# Patient Record
Sex: Male | Born: 1945 | Race: White | Hispanic: No | Smoking: Never smoker
Health system: Southern US, Community
[De-identification: ages and names within clinical notes are randomized; demographics above are authoritative.]

## PROBLEM LIST (undated history)

## (undated) DIAGNOSIS — M75101 Unspecified rotator cuff tear or rupture of right shoulder, not specified as traumatic: Secondary | ICD-10-CM

## (undated) DIAGNOSIS — M1990 Unspecified osteoarthritis, unspecified site: Secondary | ICD-10-CM

## (undated) DIAGNOSIS — J3089 Other allergic rhinitis: Secondary | ICD-10-CM

## (undated) DIAGNOSIS — R0600 Dyspnea, unspecified: Secondary | ICD-10-CM

## (undated) DIAGNOSIS — E785 Hyperlipidemia, unspecified: Secondary | ICD-10-CM

## (undated) DIAGNOSIS — N200 Calculus of kidney: Secondary | ICD-10-CM

## (undated) DIAGNOSIS — N400 Enlarged prostate without lower urinary tract symptoms: Secondary | ICD-10-CM

## (undated) DIAGNOSIS — N390 Urinary tract infection, site not specified: Secondary | ICD-10-CM

## (undated) DIAGNOSIS — L719 Rosacea, unspecified: Secondary | ICD-10-CM

## (undated) DIAGNOSIS — K219 Gastro-esophageal reflux disease without esophagitis: Secondary | ICD-10-CM

## (undated) DIAGNOSIS — A419 Sepsis, unspecified organism: Secondary | ICD-10-CM

## (undated) HISTORY — DX: Hyperlipidemia, unspecified: E78.5

## (undated) HISTORY — DX: Gastro-esophageal reflux disease without esophagitis: K21.9

## (undated) HISTORY — PX: RECONSTRUCTION OF EYELID: SHX6576

## (undated) HISTORY — PX: EXTRACORPOREAL SHOCK WAVE LITHOTRIPSY: SHX1557

## (undated) HISTORY — DX: Unspecified osteoarthritis, unspecified site: M19.90

## (undated) HISTORY — PX: URETERAL STENT PLACEMENT: SHX822

---

## 2004-10-10 ENCOUNTER — Ambulatory Visit: Payer: Self-pay

## 2004-10-24 ENCOUNTER — Ambulatory Visit: Payer: Self-pay | Admitting: Urology

## 2005-02-22 ENCOUNTER — Ambulatory Visit: Payer: Self-pay | Admitting: Urology

## 2010-04-17 ENCOUNTER — Emergency Department: Payer: Self-pay | Admitting: Emergency Medicine

## 2013-03-09 ENCOUNTER — Ambulatory Visit: Payer: Self-pay | Admitting: Internal Medicine

## 2013-09-08 ENCOUNTER — Ambulatory Visit: Payer: Self-pay | Admitting: Internal Medicine

## 2015-03-12 ENCOUNTER — Emergency Department
Admit: 2015-03-12 | Disposition: A | Discharge status: Home / Self Care | Payer: Self-pay | Admitting: Emergency Medicine

## 2015-03-12 LAB — CBC WITH DIFFERENTIAL/PLATELET
Basophil #: 0.2 10*3/uL — ABNORMAL HIGH (ref 0.0–0.1)
Basophil %: 3.4 %
Eosinophil #: 0.3 10*3/uL (ref 0.0–0.7)
Eosinophil %: 4.5 %
HCT: 49.6 % (ref 40.0–52.0)
HGB: 16.4 g/dL (ref 13.0–18.0)
LYMPHS PCT: 24.8 %
Lymphocyte #: 1.7 10*3/uL (ref 1.0–3.6)
MCH: 30.6 pg (ref 26.0–34.0)
MCHC: 33 g/dL (ref 32.0–36.0)
MCV: 93 fL (ref 80–100)
MONOS PCT: 6.7 %
Monocyte #: 0.5 x10 3/mm (ref 0.2–1.0)
NEUTROS PCT: 60.6 %
Neutrophil #: 4.2 10*3/uL (ref 1.4–6.5)
PLATELETS: 170 10*3/uL (ref 150–440)
RBC: 5.36 10*6/uL (ref 4.40–5.90)
RDW: 14 % (ref 11.5–14.5)
WBC: 7 10*3/uL (ref 3.8–10.6)

## 2015-03-12 LAB — COMPREHENSIVE METABOLIC PANEL
ALT: 54 U/L
ANION GAP: 11 (ref 7–16)
AST: 51 U/L — AB
Albumin: 5.1 g/dL — ABNORMAL HIGH
Alkaline Phosphatase: 78 U/L
BUN: 18 mg/dL
Bilirubin,Total: 3.5 mg/dL — ABNORMAL HIGH
CO2: 27 mmol/L
Calcium, Total: 9.9 mg/dL
Chloride: 102 mmol/L
Creatinine: 1.05 mg/dL
EGFR (African American): >60
EGFR (Non-African Amer.): >60
GLUCOSE: 114 mg/dL — AB
Potassium: 4.1 mmol/L
SODIUM: 140 mmol/L
Total Protein: 8.5 g/dL — ABNORMAL HIGH

## 2015-03-12 LAB — URINALYSIS, COMPLETE
BACTERIA: NONE SEEN
GLUCOSE, UR: NEGATIVE mg/dL (ref 0–75)
KETONE: NEGATIVE
Leukocyte Esterase: NEGATIVE
Nitrite: NEGATIVE
PROTEIN: NEGATIVE
Ph: 5 (ref 4.5–8.0)
SPECIFIC GRAVITY: 1.018 (ref 1.003–1.030)
Squamous Epithelial: NONE SEEN

## 2015-03-12 LAB — LIPASE, BLOOD: Lipase: 49 U/L

## 2017-12-04 ENCOUNTER — Other Ambulatory Visit: Payer: Self-pay | Admitting: Surgery

## 2017-12-04 DIAGNOSIS — M75101 Unspecified rotator cuff tear or rupture of right shoulder, not specified as traumatic: Secondary | ICD-10-CM

## 2017-12-10 ENCOUNTER — Ambulatory Visit
Admission: RE | Admit: 2017-12-10 | Discharge: 2017-12-10 | Disposition: A | Discharge status: Home / Self Care | Payer: Medicare Other | Source: Ambulatory Visit | Attending: Surgery | Admitting: Surgery

## 2017-12-10 DIAGNOSIS — M75101 Unspecified rotator cuff tear or rupture of right shoulder, not specified as traumatic: Secondary | ICD-10-CM

## 2017-12-10 DIAGNOSIS — M12811 Other specific arthropathies, not elsewhere classified, right shoulder: Secondary | ICD-10-CM | POA: Diagnosis not present

## 2018-12-07 DIAGNOSIS — N200 Calculus of kidney: Secondary | ICD-10-CM

## 2018-12-07 DIAGNOSIS — N201 Calculus of ureter: Secondary | ICD-10-CM | POA: Diagnosis not present

## 2018-12-15 ENCOUNTER — Encounter: Payer: Self-pay | Admitting: Urology

## 2018-12-15 ENCOUNTER — Ambulatory Visit (INDEPENDENT_AMBULATORY_CARE_PROVIDER_SITE_OTHER): Payer: Medicare Other | Admitting: Urology

## 2018-12-15 VITALS — BP 135/84 | HR 87 | Ht 67.0 in | Wt 224.2 lb

## 2018-12-15 DIAGNOSIS — R972 Elevated prostate specific antigen [PSA]: Secondary | ICD-10-CM

## 2018-12-15 LAB — URINALYSIS, COMPLETE
Bilirubin, UA: NEGATIVE
GLUCOSE, UA: NEGATIVE
Ketones, UA: NEGATIVE
Nitrite, UA: NEGATIVE
Protein, UA: NEGATIVE
Specific Gravity, UA: 1.02 (ref 1.005–1.030)
Urobilinogen, Ur: 0.2 mg/dL (ref 0.2–1.0)
pH, UA: 6.5 (ref 5.0–7.5)

## 2018-12-15 LAB — MICROSCOPIC EXAMINATION: Epithelial Cells (non renal): NONE SEEN /hpf (ref 0–10)

## 2018-12-15 LAB — BLADDER SCAN AMB NON-IMAGING: SCAN RESULT: 0

## 2018-12-15 NOTE — Addendum Note (Signed)
Addended by: Frankey Shown on: 12/15/2018 01:14 PM   Modules accepted: Orders

## 2018-12-15 NOTE — Progress Notes (Signed)
12/15/2018 11:35 AM   Ryan Benjamin 1946-04-18 [id number]  Referring provider: Marguarite Arbour, MD [address] [place], [place] [address]  Chief Complaint  Patient presents with  . Elevated PSA    HPI: 72 year old male seen at request of Dr. Judithann Sheen for evaluation of an elevated PSA.  A PSA drawn at [facility] on 11/11/2018 was elevated at 24.76.  Baseline PSA since 2015 has ranged in the low 3 to upper 4 range.  He has moderate lower urinary tract symptoms consisting of frequency, intermittency, urgency, weak stream and nocturia x4.  IPSS completed today was 15/2.  He states I have previously seen him twice once in late 1990s for a stone and began approximately 10 years ago for a mild PSA elevation which returned to normal.  Denies dysuria, gross hematuria or flank/abdominal/pelvic/scrotal pain.  Urinalysis at the time of his PSA was drawn showed 10-50 WBC and a urine culture was positive for Enterococcus faecalis.  He was treated with a course of Cipro although he was asymptomatic.   PMH: Past Medical History:  Diagnosis Date  . Arthritis   . GERD (gastroesophageal reflux disease)   . Hyperlipemia     Surgical History: Past Surgical History:  Procedure Laterality Date  . RECONSTRUCTION OF EYELID      Home Medications:  Allergies as of 12/15/2018   No Known Allergies     Medication List       Accurate as of December 15, 2018 11:35 AM. Always use your most recent med list.        aspirin EC 81 MG tablet Take 81 mg by mouth daily.   atorvastatin 20 MG tablet Commonly known as:  LIPITOR Take by mouth.   esomeprazole 20 MG capsule Commonly known as:  NEXIUM Take by mouth.   tamsulosin 0.4 MG Caps capsule Commonly known as:  FLOMAX Take by mouth.       Allergies: No Known Allergies  Family History: Family History  Problem Relation Age of Onset  . Prostate cancer Neg Hx   . Bladder Cancer Neg Hx   . Kidney cancer Neg Hx     Social  History:  reports that he has never smoked. He has never used smokeless tobacco. He reports that he does not drink alcohol. No history on file for drug.  ROS: UROLOGY Frequent Urination?: No Hard to postpone urination?: No Burning/pain with urination?: No Get up at night to urinate?: No Leakage of urine?: No Urine stream starts and stops?: No Trouble starting stream?: No Do you have to strain to urinate?: No Blood in urine?: No Urinary tract infection?: No Sexually transmitted disease?: No Injury to kidneys or bladder?: No Painful intercourse?: No Weak stream?: No Erection problems?: No Penile pain?: No  Gastrointestinal Nausea?: No Vomiting?: No Indigestion/heartburn?: No Diarrhea?: No Constipation?: No  Constitutional Fever: No Night sweats?: No Weight loss?: No Fatigue?: No  Skin Skin rash/lesions?: No Itching?: No  Eyes Blurred vision?: No Double vision?: No  Ears/Nose/Throat Sore throat?: No Sinus problems?: No  Hematologic/Lymphatic Swollen glands?: No Easy bruising?: No  Cardiovascular Leg swelling?: No Chest pain?: No  Respiratory Cough?: No Shortness of breath?: No  Endocrine Excessive thirst?: No  Musculoskeletal Back pain?: No Joint pain?: Yes  Neurological Headaches?: No Dizziness?: No  Psychologic Depression?: No Anxiety?: No  Physical Exam: BP 135/84 (BP Location: Left Arm, Patient Position: Sitting, Cuff Size: Large)   Pulse 87   Ht 5\' 7"  (1.702 m)   Wt  224 lb 3.2 oz (101.7 kg)   BMI 35.11 kg/m   Constitutional:  Alert and oriented, No acute distress. HEENT: [place] AT, moist mucus membranes.  Trachea midline, no masses. Cardiovascular: No clubbing, cyanosis, or edema. Respiratory: Normal respiratory effort, no increased work of breathing. GI: Abdomen is soft, nontender, nondistended, no abdominal masses GU: No CVA tenderness.  Prostate 60+ grams, smooth without nodules. Lymph: No cervical or inguinal  lymphadenopathy. Skin: No rashes, bruises or suspicious lesions. Neurologic: Grossly intact, no focal deficits, moving all 4 extremities. Psychiatric: Normal mood and affect.   Assessment & Plan:   73 year old male with a PSA elevation significantly above baseline.  DRE is benign.  Urinalysis and culture at the time his PSA was drawn were abnormal.  He has been treated with Cipro and tamsulosin.  Will repeat his PSA in approximately 1 month.  Repeat urinalysis was ordered.   Riki Altes, MD  [facility] [address] [place], [place] [address] [phone]

## 2018-12-16 ENCOUNTER — Telehealth: Payer: Self-pay | Admitting: Urology

## 2018-12-16 NOTE — Telephone Encounter (Signed)
Do not see in your note where you where planing on sending a medication, please advise?

## 2018-12-16 NOTE — Telephone Encounter (Signed)
Pt states that he thought Dr Lonna Cobb was going to call in some meds after yesterday's visit. He uses [facility] on [address], [place]

## 2018-12-17 NOTE — Telephone Encounter (Signed)
I was going to start Ryan Benjamin on tamsulosin for 30 days however it looks like Dr. Judithann Sheen had already started Ryan Benjamin on tamsulosin.  I had sent a staff message to verify the patient was taking tamsulosin.  If not can send in an Rx.

## 2018-12-18 LAB — CULTURE, URINE COMPREHENSIVE

## 2018-12-21 ENCOUNTER — Other Ambulatory Visit: Payer: Self-pay | Admitting: Urology

## 2018-12-21 MED ORDER — DOXYCYCLINE HYCLATE 100 MG PO CAPS: 100.0000 mg | ORAL_CAPSULE | Freq: Two times a day (BID) | ORAL | 0 refills | Status: AC

## 2018-12-21 NOTE — Progress Notes (Signed)
Urine culture still has bacteria present which may be a cause of his elevated PSA.  Antibiotic Rx was sent to his pharmacy.  Please make sure he is scheduled for a repeat PSA 1 month.

## 2018-12-22 NOTE — Telephone Encounter (Signed)
Pt returned call and I [facility]

## 2018-12-22 NOTE — Telephone Encounter (Signed)
-----   Message from Riki Altes, MD sent at 12/21/2018  9:58 AM EST ----- Urine culture still has bacteria present which may be a cause of his elevated PSA.  Antibiotic Rx was sent to his pharmacy.  Please make sure he is scheduled for a repeat PSA 1 month.

## 2018-12-22 NOTE — Telephone Encounter (Signed)
Left message for patient to return call.

## 2018-12-29 ENCOUNTER — Other Ambulatory Visit: Payer: Self-pay

## 2018-12-29 DIAGNOSIS — R972 Elevated prostate specific antigen [PSA]: Secondary | ICD-10-CM

## 2018-12-29 DIAGNOSIS — N410 Acute prostatitis: Secondary | ICD-10-CM

## 2018-12-29 NOTE — Telephone Encounter (Signed)
Called pt, no answer. LM for pt to call back.  

## 2018-12-29 NOTE — Telephone Encounter (Signed)
Pt returned your call Marvin. Please call back.

## 2018-12-29 NOTE — Telephone Encounter (Signed)
-----   Message from Riki Altes, MD sent at 12/15/2018 12:27 PM EST ----- I was going to send Rx Flomax to pharmacy for this patient however it looks like Dr. Judithann Sheen started him last month.  Please make sure he has medication and is still taking.  If so we will just repeat PSA 1 month.

## 2018-12-30 NOTE — Telephone Encounter (Signed)
Called pt, line picked up and then hung up.

## 2019-01-02 MED ORDER — TAMSULOSIN HCL 0.4 MG PO CAPS: 0.4000 mg | ORAL_CAPSULE | Freq: Every day | ORAL | 3 refills | Status: DC

## 2019-01-02 NOTE — Telephone Encounter (Signed)
Called pt, line picked up and then phone was hung up. 3rd attempt.   Called pt's mobile number, pt answers. He states that Dr.Sparks only gave him a short term supply of flomax. Advised pt that I would send in a long term RX. Pt also requests that his urine be checked at his upcoming lab appt will place orders.

## 2019-01-08 ENCOUNTER — Other Ambulatory Visit: Payer: Medicare Other

## 2019-01-08 DIAGNOSIS — N410 Acute prostatitis: Secondary | ICD-10-CM

## 2019-01-08 DIAGNOSIS — R972 Elevated prostate specific antigen [PSA]: Secondary | ICD-10-CM

## 2019-01-08 LAB — MICROSCOPIC EXAMINATION: Epithelial Cells (non renal): NONE SEEN /hpf (ref 0–10)

## 2019-01-08 LAB — URINALYSIS, COMPLETE
Bilirubin, UA: NEGATIVE
Glucose, UA: NEGATIVE
Leukocytes, UA: NEGATIVE
NITRITE UA: NEGATIVE
PH UA: 5.5 (ref 5.0–7.5)
Specific Gravity, UA: >1.03 — ABNORMAL HIGH (ref 1.005–1.030)
Urobilinogen, Ur: 0.2 mg/dL (ref 0.2–1.0)

## 2019-01-09 LAB — PSA: PROSTATE SPECIFIC AG, SERUM: 6.7 ng/mL — AB (ref 0.0–4.0)

## 2019-01-12 ENCOUNTER — Telehealth: Payer: Self-pay | Admitting: Family Medicine

## 2019-01-12 LAB — CULTURE, URINE COMPREHENSIVE

## 2019-01-12 NOTE — Telephone Encounter (Signed)
-----   Message from Riki Altes, MD sent at 01/12/2019  2:15 PM EST ----- PSA much better at 6.7 but still elevated above baseline.  Urine culture grew a low level of E. coli.  Would treat with additional antibiotics if he is having symptoms.  If not having symptoms recommend a follow-up in 6 weeks with Carollee Herter for a urinalysis and repeat PSA if UA clear.

## 2019-01-12 NOTE — Telephone Encounter (Signed)
Unable to reach patient the phone was not working properly.

## 2019-01-14 NOTE — Telephone Encounter (Signed)
LMOM for patient to return call.

## 2019-01-15 NOTE — Telephone Encounter (Signed)
Tried to call patient again and the phone picked up and hung up.

## 2019-01-20 NOTE — Telephone Encounter (Signed)
Called pt, no answer. No voicemail is set up will mail letter. See documention.

## 2019-02-20 ENCOUNTER — Other Ambulatory Visit: Payer: Self-pay | Admitting: Surgery

## 2019-02-20 DIAGNOSIS — M7582 Other shoulder lesions, left shoulder: Secondary | ICD-10-CM

## 2019-02-24 ENCOUNTER — Other Ambulatory Visit: Payer: Medicare Other

## 2019-02-26 ENCOUNTER — Ambulatory Visit: Payer: Medicare Other | Admitting: Urology

## 2019-03-23 ENCOUNTER — Other Ambulatory Visit: Payer: Self-pay

## 2019-03-23 DIAGNOSIS — R972 Elevated prostate specific antigen [PSA]: Secondary | ICD-10-CM

## 2019-03-24 ENCOUNTER — Other Ambulatory Visit: Payer: Self-pay

## 2019-03-24 ENCOUNTER — Other Ambulatory Visit: Payer: Medicare Other

## 2019-03-24 ENCOUNTER — Ambulatory Visit: Payer: Medicare Other

## 2019-03-24 DIAGNOSIS — R972 Elevated prostate specific antigen [PSA]: Secondary | ICD-10-CM

## 2019-03-25 LAB — PSA: Prostate Specific Ag, Serum: 5.8 ng/mL — ABNORMAL HIGH (ref 0.0–4.0)

## 2019-03-27 ENCOUNTER — Ambulatory Visit: Payer: Medicare Other | Admitting: Urology

## 2019-04-07 ENCOUNTER — Other Ambulatory Visit: Payer: Self-pay

## 2019-04-07 ENCOUNTER — Other Ambulatory Visit: Payer: Medicare Other

## 2019-04-07 ENCOUNTER — Ambulatory Visit
Admission: RE | Admit: 2019-04-07 | Discharge: 2019-04-07 | Disposition: A | Discharge status: Home / Self Care | Payer: Medicare Other | Source: Ambulatory Visit | Attending: Surgery | Admitting: Surgery

## 2019-04-07 DIAGNOSIS — M7582 Other shoulder lesions, left shoulder: Secondary | ICD-10-CM | POA: Insufficient documentation

## 2019-04-07 DIAGNOSIS — R972 Elevated prostate specific antigen [PSA]: Secondary | ICD-10-CM

## 2019-04-07 LAB — URINALYSIS, COMPLETE
Bilirubin, UA: NEGATIVE
Glucose, UA: NEGATIVE
Nitrite, UA: NEGATIVE
Specific Gravity, UA: 1.025 (ref 1.005–1.030)
Urobilinogen, Ur: 0.2 mg/dL (ref 0.2–1.0)
pH, UA: 6 (ref 5.0–7.5)

## 2019-04-07 LAB — MICROSCOPIC EXAMINATION: WBC, UA: >30 /hpf — AB (ref 0–5)

## 2019-04-07 NOTE — Progress Notes (Unsigned)
Per Dr. Lonna Cobb patient was notified that urine today will be sent for culture and we will not send PSA . A renal u/s has been ordered and we will call with results. Will wait to treat on cx patient is not having symptoms

## 2019-04-09 LAB — CULTURE, URINE COMPREHENSIVE

## 2019-04-10 ENCOUNTER — Other Ambulatory Visit: Payer: Self-pay | Admitting: Urology

## 2019-04-10 ENCOUNTER — Encounter: Payer: Self-pay | Admitting: Urology

## 2019-04-10 MED ORDER — AMOXICILLIN 875 MG PO TABS: 875.0000 mg | ORAL_TABLET | Freq: Two times a day (BID) | ORAL | 0 refills | Status: AC

## 2019-04-13 ENCOUNTER — Telehealth: Payer: Self-pay

## 2019-04-13 NOTE — Telephone Encounter (Signed)
Called pt no answer. LM informing pt of information below.  

## 2019-04-13 NOTE — Telephone Encounter (Signed)
-----   Message from Riki Altes, MD sent at 04/09/2019 11:59 PM EDT ----- Urine culture was positive.  Antibiotic Rx was sent to pharmacy.  Notified via MyChart

## 2019-04-14 ENCOUNTER — Ambulatory Visit: Payer: Medicare Other | Admitting: Urology

## 2019-04-16 ENCOUNTER — Other Ambulatory Visit: Payer: Self-pay

## 2019-04-16 ENCOUNTER — Telehealth (INDEPENDENT_AMBULATORY_CARE_PROVIDER_SITE_OTHER): Payer: Medicare Other | Admitting: Urology

## 2019-04-16 DIAGNOSIS — N200 Calculus of kidney: Secondary | ICD-10-CM | POA: Insufficient documentation

## 2019-04-16 DIAGNOSIS — R8271 Bacteriuria: Secondary | ICD-10-CM

## 2019-04-16 DIAGNOSIS — R972 Elevated prostate specific antigen [PSA]: Secondary | ICD-10-CM | POA: Insufficient documentation

## 2019-04-16 DIAGNOSIS — N400 Enlarged prostate without lower urinary tract symptoms: Secondary | ICD-10-CM | POA: Insufficient documentation

## 2019-04-16 DIAGNOSIS — N401 Enlarged prostate with lower urinary tract symptoms: Secondary | ICD-10-CM

## 2019-04-16 NOTE — Progress Notes (Signed)
Virtual Visit via Telephone Note  I connected with Ryan Benjamin on 04/16/19 at 11:30 AM EDT by telephone and verified that I am speaking with the correct person using two identifiers.  Location: Patient: Home Provider: Home   I discussed the limitations, risks, security and privacy concerns of performing an evaluation and management service by telephone and the availability of in person appointments. I also discussed with the patient that there may be a patient responsible charge related to this service. The patient expressed understanding and agreed to proceed.   History of Present Illness: 73 year old male presents for follow-up visit via telephone secondary to COVID-19 pandemic.  He was initially referred in January 2020 for a PSA of 24.76.  He did have moderate lower urinary tract symptoms.  He had significant pyuria at the time his PSA was drawn and had enterococcus bacteriuria.  Since January he has had 3+ urine cultures 2 for enterococcus and 1 for E. coli and has been asymptomatic.  He had no significant PVR by bladder scan.  He does have a history of stone disease and a CT in 2016 did show bilateral nephrolithiasis.  Denies flank or abdominal pain.  He is on tamsulosin and denies bothersome lower urinary tract symptoms.  PSA trend as follows: 08/2014  3.16 09/2015 3.87 09/2016 3.17 09/2017 4.94 12/2017  3.84 03/2018  3.12 09/2018 4.93 11/2018 24.76 01/2019  6.7 03/2019  5.8  He does have a ED and has been on sildenafil prescribed by Dr. Judithann Sheen.  He states he recently saw Dr. Judithann Sheen and had a testosterone level checked because he did not feel the sildenafil was as effective.   Observations/Objective:   Assessment and Plan: 1.  Chronic asymptomatic bacteriuria -Prior bladder scan for PVR was 0 mL.  Although his bacteria are not typically urease producers he has a history of nephrolithiasis and will order a renal ultrasound/KUB.  2.  Elevated PSA -PSA is mildly elevated  and may be secondary to inflammation.  We discussed possibility of prostate cancer and management options of continued surveillance, prostate biopsy or prostate MRI.  He would like an MRI if covered by insurance.  3.  Erectile dysfunction -He was informed that hypogonadism typically does not make a big difference in erections and has most of an impact with libido and energy level.  If his testosterone level was low and he was interested in replacement he would need a prostate biopsy.  Follow Up Instructions: As above   I discussed the assessment and treatment plan with the patient. The patient was provided an opportunity to ask questions and all were answered. The patient agreed with the plan and demonstrated an understanding of the instructions.   The patient was advised to call back or seek an in-person evaluation if the symptoms worsen or if the condition fails to improve as anticipated.  I provided 15 minutes of non-face-to-face time during this encounter.   Riki Altes, MD

## 2019-04-22 ENCOUNTER — Other Ambulatory Visit: Payer: Self-pay

## 2019-04-30 ENCOUNTER — Ambulatory Visit
Admission: RE | Admit: 2019-04-30 | Discharge: 2019-04-30 | Disposition: A | Discharge status: Home / Self Care | Payer: Medicare Other | Source: Ambulatory Visit | Attending: Urology | Admitting: Urology

## 2019-04-30 ENCOUNTER — Other Ambulatory Visit: Payer: Self-pay

## 2019-04-30 DIAGNOSIS — N200 Calculus of kidney: Secondary | ICD-10-CM

## 2019-05-14 ENCOUNTER — Other Ambulatory Visit: Payer: Self-pay

## 2019-05-14 ENCOUNTER — Ambulatory Visit
Admission: RE | Admit: 2019-05-14 | Discharge: 2019-05-14 | Disposition: A | Discharge status: Home / Self Care | Payer: Medicare Other | Source: Ambulatory Visit | Attending: Urology | Admitting: Urology

## 2019-05-14 DIAGNOSIS — R972 Elevated prostate specific antigen [PSA]: Secondary | ICD-10-CM | POA: Insufficient documentation

## 2019-05-14 MED ORDER — GADOBUTROL 1 MMOL/ML IV SOLN
10.0000 mL | Freq: Once | INTRAVENOUS | Status: AC | PRN
  Administered 2019-05-14: 10 mL via INTRAVENOUS

## 2019-05-15 ENCOUNTER — Telehealth: Payer: Self-pay | Admitting: Urology

## 2019-05-15 NOTE — Telephone Encounter (Signed)
KUB and renal ultrasound show a nonobstructing stone in the left kidney which can be monitored.  It is unlikely this would be a cause of bacteria in his urine.  Prostate MRI showed no area suspicious for prostate cancer.  Prostate enlargement was noted.  Recommend a six-month follow-up for DRE, KUB and PSA.

## 2019-05-15 NOTE — Telephone Encounter (Signed)
LMOM for patient to return call.

## 2019-05-15 NOTE — Telephone Encounter (Signed)
Patient notified

## 2019-05-20 NOTE — Telephone Encounter (Signed)
Patient was notified that the report does not state a size of stone just states small probable stone. Patient verbalized understanding

## 2019-05-20 NOTE — Telephone Encounter (Signed)
Pt called and would like a call back concerning his kidney stone, he was asking how big it is, etc..Please advise.

## 2019-06-24 ENCOUNTER — Encounter
Admission: RE | Admit: 2019-06-24 | Discharge: 2019-06-24 | Disposition: A | Discharge status: Home / Self Care | Payer: Medicare Other | Source: Ambulatory Visit | Attending: Surgery | Admitting: Surgery

## 2019-06-24 ENCOUNTER — Other Ambulatory Visit: Payer: Self-pay

## 2019-06-24 DIAGNOSIS — R001 Bradycardia, unspecified: Secondary | ICD-10-CM | POA: Diagnosis not present

## 2019-06-24 DIAGNOSIS — I10 Essential (primary) hypertension: Secondary | ICD-10-CM | POA: Insufficient documentation

## 2019-06-24 DIAGNOSIS — Z01818 Encounter for other preprocedural examination: Secondary | ICD-10-CM | POA: Insufficient documentation

## 2019-06-24 LAB — URINALYSIS, ROUTINE W REFLEX MICROSCOPIC
Bacteria, UA: NONE SEEN
Bilirubin Urine: NEGATIVE
Glucose, UA: NEGATIVE mg/dL
Ketones, ur: NEGATIVE mg/dL
Nitrite: NEGATIVE
Protein, ur: NEGATIVE mg/dL
Specific Gravity, Urine: 1.027 (ref 1.005–1.030)
Squamous Epithelial / HPF: NONE SEEN (ref 0–5)
pH: 5 (ref 5.0–8.0)

## 2019-06-24 LAB — TYPE AND SCREEN
ABO/RH(D): O POS
Antibody Screen: NEGATIVE

## 2019-06-24 LAB — CBC
HCT: 45.4 % (ref 39.0–52.0)
Hemoglobin: 15.1 g/dL (ref 13.0–17.0)
MCH: 31 pg (ref 26.0–34.0)
MCHC: 33.3 g/dL (ref 30.0–36.0)
MCV: 93.2 fL (ref 80.0–100.0)
Platelets: 211 10*3/uL (ref 150–400)
RBC: 4.87 MIL/uL (ref 4.22–5.81)
RDW: 12.6 % (ref 11.5–15.5)
WBC: 9.9 10*3/uL (ref 4.0–10.5)
nRBC: 0 % (ref 0.0–0.2)

## 2019-06-24 LAB — BASIC METABOLIC PANEL
Anion gap: 11 (ref 5–15)
BUN: 24 mg/dL — ABNORMAL HIGH (ref 8–23)
CO2: 25 mmol/L (ref 22–32)
Calcium: 9.3 mg/dL (ref 8.9–10.3)
Chloride: 104 mmol/L (ref 98–111)
Creatinine, Ser: 0.85 mg/dL (ref 0.61–1.24)
GFR calc Af Amer: >60 mL/min (ref >60)
GFR calc non Af Amer: >60 mL/min (ref >60)
Glucose, Bld: 96 mg/dL (ref 70–99)
Potassium: 3.6 mmol/L (ref 3.5–5.1)
Sodium: 140 mmol/L (ref 135–145)

## 2019-06-24 LAB — SURGICAL PCR SCREEN
MRSA, PCR: NEGATIVE
Staphylococcus aureus: POSITIVE — AB

## 2019-06-24 NOTE — Patient Instructions (Signed)
Your procedure is scheduled on: 06/30/19 Report to Day Surgery. [facility] SECOND FLOOR To find out your arrival time please call [phone] between 1PM - 3PM on 06/29/19.  Remember: Instructions that are not followed completely may result in serious medical risk,  up to and including death, or upon the discretion of your surgeon and anesthesiologist your  surgery may need to be rescheduled.     _X__ 1. Do not eat food after midnight the night before your procedure.                 No gum chewing or hard candies. You may drink clear liquids up to 2 hours                 before you are scheduled to arrive for your surgery- DO not drink clear                 liquids within 2 hours of the start of your surgery.                 Clear Liquids include:  water, apple juice without pulp, clear carbohydrate                 drink such as Clearfast of Gatorade, Black Coffee or Tea (Do not add                 anything to coffee or tea).  __X__2.  On the morning of surgery brush your teeth with toothpaste and water, you                may rinse your mouth with mouthwash if you wish.  Do not swallow any toothpaste of mouthwash.     _X__ 3.  No Alcohol for 24 hours before or after surgery.   _X__ 4.  Do Not Smoke or use e-cigarettes For 24 Hours Prior to Your Surgery.                 Do not use any chewable tobacco products for at least 6 hours prior to                 surgery.  ____  5.  Bring all medications with you on the day of surgery if instructed.   __X__  6.  Notify your doctor if there is any change in your medical condition      (cold, fever, infections).     Do not wear jewelry, make-up, hairpins, clips or nail polish. Do not wear lotions, powders, or perfumes. You may wear deodorant. Do not shave 48 hours prior to surgery. Men may shave face and neck. Do not bring valuables to the hospital.    [facility] is not responsible for any belongings or  valuables.  Contacts, dentures or bridgework may not be worn into surgery. Leave your suitcase in the car. After surgery it may be brought to your room. For patients admitted to the hospital, discharge time is determined by your treatment team.   Patients discharged the day of surgery will not be allowed to drive home.   Please read over the following fact sheets that you were given:   MRSA Information/ TOTAL JOINT BOOKLET          __X__ Take these medicines the morning of surgery with A SIP OF WATER:    1.NEXIUM AT BEDTIME 06/29/19 AND AM OF SURGERY  2. TAMSULOSIN  3.   4.  5.  6.  ____ Fleet Enema (as directed)   _X___ Use CHG Soap as directed  ____ Use inhalers on the day of surgery  ____ Stop metformin 2 days prior to surgery    ____ Take 1/2 of usual insulin dose the night before surgery. No insulin the morning          of surgery.   _X___ Stop Coumadin/Plavix/aspirin on   STOP ASPIRIN 06/24/19  ____ Stop Anti-inflammatories on    ____ Stop supplements until after surgery.    ____ Bring C-Pap to the hospital.

## 2019-06-24 NOTE — Pre-Procedure Instructions (Signed)
UA FAXED TO DR POGGI 

## 2019-06-25 NOTE — Pre-Procedure Instructions (Signed)
POS STAPH FAXED TO DR Roland Rack

## 2019-06-26 ENCOUNTER — Other Ambulatory Visit
Admission: RE | Admit: 2019-06-26 | Discharge: 2019-06-26 | Disposition: A | Discharge status: Home / Self Care | Payer: Medicare Other | Source: Ambulatory Visit | Attending: Surgery | Admitting: Surgery

## 2019-06-26 ENCOUNTER — Other Ambulatory Visit: Payer: Self-pay

## 2019-06-26 DIAGNOSIS — Z01818 Encounter for other preprocedural examination: Secondary | ICD-10-CM | POA: Diagnosis not present

## 2019-06-26 LAB — URINE CULTURE: Culture: 70000 — AB

## 2019-06-27 LAB — SARS CORONAVIRUS 2 (TAT 6-24 HRS): SARS Coronavirus 2: NEGATIVE

## 2019-06-29 MED ORDER — CEFAZOLIN SODIUM-DEXTROSE 2-4 GM/100ML-% IV SOLN
2.0000 g | Freq: Once | INTRAVENOUS | Status: AC
  Administered 2019-06-30: 2 g via INTRAVENOUS

## 2019-06-30 ENCOUNTER — Inpatient Hospital Stay
Admission: RE | Admit: 2019-06-30 | Discharge: 2019-07-01 | DRG: 483 | Disposition: A | Discharge status: Home Health | Payer: Medicare Other | Attending: Surgery | Admitting: Surgery

## 2019-06-30 ENCOUNTER — Encounter
Admission: RE | Disposition: A | Discharge status: Home Health | Payer: Self-pay | Source: Home / Self Care | Attending: Surgery

## 2019-06-30 ENCOUNTER — Inpatient Hospital Stay: Payer: Medicare Other

## 2019-06-30 ENCOUNTER — Inpatient Hospital Stay: Payer: Medicare Other | Admitting: Anesthesiology

## 2019-06-30 ENCOUNTER — Other Ambulatory Visit: Payer: Self-pay

## 2019-06-30 DIAGNOSIS — M75122 Complete rotator cuff tear or rupture of left shoulder, not specified as traumatic: Principal | ICD-10-CM | POA: Diagnosis present

## 2019-06-30 DIAGNOSIS — K219 Gastro-esophageal reflux disease without esophagitis: Secondary | ICD-10-CM | POA: Diagnosis present

## 2019-06-30 DIAGNOSIS — E785 Hyperlipidemia, unspecified: Secondary | ICD-10-CM | POA: Diagnosis present

## 2019-06-30 DIAGNOSIS — Z79899 Other long term (current) drug therapy: Secondary | ICD-10-CM | POA: Diagnosis not present

## 2019-06-30 DIAGNOSIS — Z96612 Presence of left artificial shoulder joint: Secondary | ICD-10-CM

## 2019-06-30 HISTORY — PX: REVERSE SHOULDER ARTHROPLASTY: SHX5054

## 2019-06-30 LAB — ABO/RH: ABO/RH(D): O POS

## 2019-06-30 SURGERY — ARTHROPLASTY, SHOULDER, TOTAL, REVERSE
Anesthesia: General | Site: Shoulder | Laterality: Left

## 2019-06-30 MED ORDER — HYDROMORPHONE HCL 1 MG/ML IJ SOLN: 0.2500 mg | INTRAMUSCULAR | Status: DC | PRN

## 2019-06-30 MED ORDER — FENTANYL CITRATE (PF) 100 MCG/2ML IJ SOLN
50.0000 ug | Freq: Once | INTRAMUSCULAR | Status: AC
  Administered 2019-06-30: 50 ug via INTRAVENOUS

## 2019-06-30 MED ORDER — FENTANYL CITRATE (PF) 100 MCG/2ML IJ SOLN
INTRAMUSCULAR | Status: AC
  Filled 2019-06-30: qty 2

## 2019-06-30 MED ORDER — BUPIVACAINE-EPINEPHRINE (PF) 0.5% -1:200000 IJ SOLN
INTRAMUSCULAR | Status: DC | PRN
  Administered 2019-06-30: 30 mL via PERINEURAL

## 2019-06-30 MED ORDER — SULFAMETHOXAZOLE-TRIMETHOPRIM 800-160 MG PO TABS
1.0000 | ORAL_TABLET | Freq: Two times a day (BID) | ORAL | Status: DC
  Administered 2019-06-30 – 2019-07-01 (×2): 1 via ORAL
  Filled 2019-06-30 (×2): qty 1

## 2019-06-30 MED ORDER — ROCURONIUM BROMIDE 100 MG/10ML IV SOLN
INTRAVENOUS | Status: DC | PRN
  Administered 2019-06-30: 50 mg via INTRAVENOUS

## 2019-06-30 MED ORDER — PHENYLEPHRINE HCL (PRESSORS) 10 MG/ML IV SOLN
INTRAVENOUS | Status: DC | PRN
  Administered 2019-06-30: 100 ug via INTRAVENOUS
  Administered 2019-06-30: 200 ug via INTRAVENOUS

## 2019-06-30 MED ORDER — OXYCODONE HCL 5 MG PO TABS: 5.0000 mg | ORAL_TABLET | Freq: Once | ORAL | Status: DC | PRN

## 2019-06-30 MED ORDER — ACETAMINOPHEN ER 650 MG PO TBCR: 1300.0000 mg | EXTENDED_RELEASE_TABLET | Freq: Every day | ORAL | Status: DC

## 2019-06-30 MED ORDER — METOCLOPRAMIDE HCL 5 MG/ML IJ SOLN: 5.0000 mg | Freq: Three times a day (TID) | INTRAMUSCULAR | Status: DC | PRN

## 2019-06-30 MED ORDER — METOCLOPRAMIDE HCL 10 MG PO TABS: 5.0000 mg | ORAL_TABLET | Freq: Three times a day (TID) | ORAL | Status: DC | PRN

## 2019-06-30 MED ORDER — PHENYLEPHRINE HCL (PRESSORS) 10 MG/ML IV SOLN
INTRAVENOUS | Status: AC
  Filled 2019-06-30: qty 1

## 2019-06-30 MED ORDER — ASPIRIN EC 81 MG PO TBEC
81.0000 mg | DELAYED_RELEASE_TABLET | Freq: Every day | ORAL | Status: DC
  Administered 2019-06-30: 81 mg via ORAL
  Filled 2019-06-30: qty 1

## 2019-06-30 MED ORDER — ACETAMINOPHEN 10 MG/ML IV SOLN
INTRAVENOUS | Status: DC | PRN
  Administered 2019-06-30: 1000 mg via INTRAVENOUS

## 2019-06-30 MED ORDER — BUPIVACAINE HCL (PF) 0.5 % IJ SOLN
INTRAMUSCULAR | Status: DC | PRN
  Administered 2019-06-30: 3 mL via PERINEURAL
  Administered 2019-06-30: 7 mL via PERINEURAL

## 2019-06-30 MED ORDER — SODIUM CHLORIDE 0.9 % IV SOLN
INTRAVENOUS | Status: DC
  Administered 2019-06-30: 15:00:00 via INTRAVENOUS

## 2019-06-30 MED ORDER — BISACODYL 10 MG RE SUPP: 10.0000 mg | Freq: Every day | RECTAL | Status: DC | PRN

## 2019-06-30 MED ORDER — BUPIVACAINE-EPINEPHRINE (PF) 0.5% -1:200000 IJ SOLN
INTRAMUSCULAR | Status: AC
  Filled 2019-06-30: qty 30

## 2019-06-30 MED ORDER — LORATADINE 10 MG PO TABS
10.0000 mg | ORAL_TABLET | Freq: Every day | ORAL | Status: DC
  Administered 2019-06-30: 10 mg via ORAL
  Filled 2019-06-30: qty 1

## 2019-06-30 MED ORDER — OXYCODONE HCL 5 MG/5ML PO SOLN: 5.0000 mg | Freq: Once | ORAL | Status: DC | PRN

## 2019-06-30 MED ORDER — ACETAMINOPHEN 325 MG PO TABS: 325.0000 mg | ORAL_TABLET | Freq: Four times a day (QID) | ORAL | Status: DC | PRN

## 2019-06-30 MED ORDER — DOCUSATE SODIUM 100 MG PO CAPS
100.0000 mg | ORAL_CAPSULE | Freq: Two times a day (BID) | ORAL | Status: DC
  Administered 2019-06-30 – 2019-07-01 (×2): 100 mg via ORAL
  Filled 2019-06-30 (×2): qty 1

## 2019-06-30 MED ORDER — OXYMETAZOLINE HCL 0.05 % NA SOLN
1.0000 | Freq: Two times a day (BID) | NASAL | Status: DC | PRN
  Filled 2019-06-30: qty 15

## 2019-06-30 MED ORDER — TRANEXAMIC ACID 1000 MG/10ML IV SOLN
INTRAVENOUS | Status: AC
  Filled 2019-06-30: qty 10

## 2019-06-30 MED ORDER — SUGAMMADEX SODIUM 500 MG/5ML IV SOLN
INTRAVENOUS | Status: DC | PRN
  Administered 2019-06-30: 200 mg via INTRAVENOUS

## 2019-06-30 MED ORDER — ONDANSETRON HCL 4 MG PO TABS: 4.0000 mg | ORAL_TABLET | Freq: Four times a day (QID) | ORAL | Status: DC | PRN

## 2019-06-30 MED ORDER — ATORVASTATIN CALCIUM 20 MG PO TABS
20.0000 mg | ORAL_TABLET | Freq: Every day | ORAL | Status: DC
  Administered 2019-06-30: 20 mg via ORAL
  Filled 2019-06-30: qty 1

## 2019-06-30 MED ORDER — MIDAZOLAM HCL 2 MG/2ML IJ SOLN
INTRAMUSCULAR | Status: DC | PRN
  Administered 2019-06-30: 2 mg via INTRAVENOUS

## 2019-06-30 MED ORDER — MIDAZOLAM HCL 2 MG/2ML IJ SOLN
1.0000 mg | Freq: Once | INTRAMUSCULAR | Status: AC
  Administered 2019-06-30: 1 mg via INTRAVENOUS

## 2019-06-30 MED ORDER — PROPOFOL 10 MG/ML IV BOLUS
INTRAVENOUS | Status: AC
  Filled 2019-06-30: qty 20

## 2019-06-30 MED ORDER — EPHEDRINE SULFATE 50 MG/ML IJ SOLN
INTRAMUSCULAR | Status: AC
  Filled 2019-06-30: qty 1

## 2019-06-30 MED ORDER — CEFAZOLIN SODIUM-DEXTROSE 2-4 GM/100ML-% IV SOLN
INTRAVENOUS | Status: AC
  Filled 2019-06-30: qty 100

## 2019-06-30 MED ORDER — OXYCODONE HCL 5 MG PO TABS: 5.0000 mg | ORAL_TABLET | ORAL | Status: DC | PRN

## 2019-06-30 MED ORDER — MIDAZOLAM HCL 2 MG/2ML IJ SOLN
INTRAMUSCULAR | Status: AC
  Filled 2019-06-30: qty 2

## 2019-06-30 MED ORDER — DIPHENHYDRAMINE HCL 12.5 MG/5ML PO ELIX: 12.5000 mg | ORAL_SOLUTION | ORAL | Status: DC | PRN

## 2019-06-30 MED ORDER — ACETAMINOPHEN 500 MG PO TABS
1000.0000 mg | ORAL_TABLET | Freq: Four times a day (QID) | ORAL | Status: DC
  Administered 2019-06-30 – 2019-07-01 (×2): 1000 mg via ORAL
  Filled 2019-06-30 (×2): qty 2

## 2019-06-30 MED ORDER — BUPIVACAINE LIPOSOME 1.3 % IJ SUSP
INTRAMUSCULAR | Status: DC | PRN
  Administered 2019-06-30: 13 mL via PERINEURAL
  Administered 2019-06-30: 7 mL via PERINEURAL

## 2019-06-30 MED ORDER — LACTATED RINGERS IV SOLN
INTRAVENOUS | Status: DC
  Administered 2019-06-30 (×2): via INTRAVENOUS

## 2019-06-30 MED ORDER — VASOPRESSIN 20 UNIT/ML IV SOLN
INTRAVENOUS | Status: AC
  Filled 2019-06-30: qty 1

## 2019-06-30 MED ORDER — LACTATED RINGERS IV SOLN
INTRAVENOUS | Status: DC | PRN
  Administered 2019-06-30: 10:00:00 via INTRAVENOUS

## 2019-06-30 MED ORDER — ONDANSETRON HCL 4 MG/2ML IJ SOLN: 4.0000 mg | Freq: Four times a day (QID) | INTRAMUSCULAR | Status: DC | PRN

## 2019-06-30 MED ORDER — POLYVINYL ALCOHOL 1.4 % OP SOLN
1.0000 [drp] | Freq: Every day | OPHTHALMIC | Status: DC | PRN
  Filled 2019-06-30: qty 15

## 2019-06-30 MED ORDER — LIDOCAINE HCL (PF) 1 % IJ SOLN
INTRAMUSCULAR | Status: AC
  Filled 2019-06-30: qty 5

## 2019-06-30 MED ORDER — CEFAZOLIN SODIUM-DEXTROSE 2-4 GM/100ML-% IV SOLN
2.0000 g | Freq: Four times a day (QID) | INTRAVENOUS | Status: AC
  Administered 2019-06-30 – 2019-07-01 (×3): 2 g via INTRAVENOUS
  Filled 2019-06-30 (×3): qty 100

## 2019-06-30 MED ORDER — ROCURONIUM BROMIDE 50 MG/5ML IV SOLN
INTRAVENOUS | Status: AC
  Filled 2019-06-30: qty 1

## 2019-06-30 MED ORDER — DEXAMETHASONE SODIUM PHOSPHATE 10 MG/ML IJ SOLN
INTRAMUSCULAR | Status: DC | PRN
  Administered 2019-06-30: 5 mg via INTRAVENOUS

## 2019-06-30 MED ORDER — MAGNESIUM HYDROXIDE 400 MG/5ML PO SUSP: 30.0000 mL | Freq: Every day | ORAL | Status: DC | PRN

## 2019-06-30 MED ORDER — SODIUM CHLORIDE FLUSH 0.9 % IV SOLN
INTRAVENOUS | Status: AC
  Filled 2019-06-30: qty 40

## 2019-06-30 MED ORDER — BUPIVACAINE HCL (PF) 0.5 % IJ SOLN
INTRAMUSCULAR | Status: AC
  Filled 2019-06-30: qty 10

## 2019-06-30 MED ORDER — EPHEDRINE SULFATE 50 MG/ML IJ SOLN
INTRAMUSCULAR | Status: DC | PRN
  Administered 2019-06-30: 20 mg via INTRAVENOUS
  Administered 2019-06-30: 15 mg via INTRAVENOUS

## 2019-06-30 MED ORDER — VASOPRESSIN 20 UNIT/ML IV SOLN
INTRAVENOUS | Status: DC | PRN
  Administered 2019-06-30: 2 [IU] via INTRAVENOUS
  Administered 2019-06-30: 1 [IU] via INTRAVENOUS

## 2019-06-30 MED ORDER — FENTANYL CITRATE (PF) 100 MCG/2ML IJ SOLN
INTRAMUSCULAR | Status: DC | PRN
  Administered 2019-06-30: 50 ug via INTRAVENOUS

## 2019-06-30 MED ORDER — ACETAMINOPHEN 10 MG/ML IV SOLN
INTRAVENOUS | Status: AC
  Filled 2019-06-30: qty 100

## 2019-06-30 MED ORDER — ENOXAPARIN SODIUM 40 MG/0.4ML ~~LOC~~ SOLN
40.0000 mg | SUBCUTANEOUS | Status: DC
  Administered 2019-07-01: 40 mg via SUBCUTANEOUS
  Filled 2019-06-30: qty 0.4

## 2019-06-30 MED ORDER — PANTOPRAZOLE SODIUM 40 MG PO TBEC
40.0000 mg | DELAYED_RELEASE_TABLET | Freq: Every day | ORAL | Status: DC
  Administered 2019-07-01: 40 mg via ORAL
  Filled 2019-06-30: qty 1

## 2019-06-30 MED ORDER — TRANEXAMIC ACID 1000 MG/10ML IV SOLN
INTRAVENOUS | Status: DC | PRN
  Administered 2019-06-30: 1000 mg via INTRAVENOUS

## 2019-06-30 MED ORDER — TAMSULOSIN HCL 0.4 MG PO CAPS
0.4000 mg | ORAL_CAPSULE | Freq: Every day | ORAL | Status: DC
  Administered 2019-07-01: 09:00:00 0.4 mg via ORAL
  Filled 2019-06-30: qty 1

## 2019-06-30 MED ORDER — PROPOFOL 10 MG/ML IV BOLUS
INTRAVENOUS | Status: DC | PRN
  Administered 2019-06-30: 180 mg via INTRAVENOUS

## 2019-06-30 MED ORDER — TRAMADOL HCL 50 MG PO TABS
50.0000 mg | ORAL_TABLET | Freq: Four times a day (QID) | ORAL | Status: DC | PRN
  Administered 2019-06-30 – 2019-07-01 (×2): 50 mg via ORAL
  Filled 2019-06-30 (×2): qty 1

## 2019-06-30 MED ORDER — FLEET ENEMA 7-19 GM/118ML RE ENEM: 1.0000 | ENEMA | Freq: Once | RECTAL | Status: DC | PRN

## 2019-06-30 MED ORDER — FENTANYL CITRATE (PF) 100 MCG/2ML IJ SOLN: 25.0000 ug | INTRAMUSCULAR | Status: DC | PRN

## 2019-06-30 MED ORDER — ONDANSETRON HCL 4 MG/2ML IJ SOLN
INTRAMUSCULAR | Status: DC | PRN
  Administered 2019-06-30: 4 mg via INTRAVENOUS

## 2019-06-30 MED ORDER — SUCCINYLCHOLINE CHLORIDE 20 MG/ML IJ SOLN
INTRAMUSCULAR | Status: AC
  Filled 2019-06-30: qty 1

## 2019-06-30 MED ORDER — BUPIVACAINE LIPOSOME 1.3 % IJ SUSP
INTRAMUSCULAR | Status: AC
  Filled 2019-06-30: qty 20

## 2019-06-30 SURGICAL SUPPLY — 68 items
BASEPLATE GLENOSPHERE 25 (Plate) ×1 IMPLANT
BASEPLATE GLENOSPHERE 25MM (Plate) ×1 IMPLANT
BEARING HUMERAL 40 STD VITE (Joint) ×2 IMPLANT
BIT DRILL TWIST 2.7 (BIT) ×1 IMPLANT
BIT DRILL TWIST 2.7MM (BIT) ×1
BLADE SAW SAG 25X90X1.19 (BLADE) ×3 IMPLANT
CANISTER SUCT 1200ML W/VALVE (MISCELLANEOUS) ×3 IMPLANT
CANISTER SUCT 3000ML PPV (MISCELLANEOUS) ×6 IMPLANT
CHLORAPREP W/TINT 26 (MISCELLANEOUS) ×3 IMPLANT
COOLER POLAR GLACIER W/PUMP (MISCELLANEOUS) ×3 IMPLANT
COVER BACK TABLE REUSABLE LG (DRAPES) ×3 IMPLANT
COVER WAND RF STERILE (DRAPES) ×3 IMPLANT
CRADLE LAMINECT ARM (MISCELLANEOUS) ×3 IMPLANT
DIAL VERSA SHOULDER 40 STD (Joint) ×2 IMPLANT
DRAPE 3/4 80X56 (DRAPES) ×6 IMPLANT
DRAPE INCISE IOBAN 66X45 STRL (DRAPES) ×6 IMPLANT
DRAPE SPLIT 6X30 W/TAPE (DRAPES) ×6 IMPLANT
DRSG OPSITE POSTOP 4X8 (GAUZE/BANDAGES/DRESSINGS) ×3 IMPLANT
ELECT BLADE 6.5 EXT (BLADE) IMPLANT
ELECT CAUTERY BLADE 6.4 (BLADE) ×3 IMPLANT
GLOVE BIO SURGEON STRL SZ7.5 (GLOVE) ×12 IMPLANT
GLOVE BIO SURGEON STRL SZ8 (GLOVE) ×16 IMPLANT
GLOVE BIOGEL PI IND STRL 8 (GLOVE) ×1 IMPLANT
GLOVE BIOGEL PI INDICATOR 8 (GLOVE) ×2
GLOVE INDICATOR 8.0 STRL GRN (GLOVE) ×3 IMPLANT
GOWN STRL REUS W/ TWL LRG LVL3 (GOWN DISPOSABLE) ×1 IMPLANT
GOWN STRL REUS W/ TWL XL LVL3 (GOWN DISPOSABLE) ×1 IMPLANT
GOWN STRL REUS W/TWL LRG LVL3 (GOWN DISPOSABLE) ×2
GOWN STRL REUS W/TWL XL LVL3 (GOWN DISPOSABLE) ×2
HOOD PEEL AWAY FLYTE STAYCOOL (MISCELLANEOUS) ×9 IMPLANT
KIT STABILIZATION SHOULDER (MISCELLANEOUS) ×3 IMPLANT
KIT TURNOVER KIT A (KITS) ×3 IMPLANT
MASK FACE SPIDER DISP (MASK) ×3 IMPLANT
MAT ABSORB  FLUID 56X50 GRAY (MISCELLANEOUS) ×2
MAT ABSORB FLUID 56X50 GRAY (MISCELLANEOUS) ×1 IMPLANT
NDL SAFETY ECLIPSE 18X1.5 (NEEDLE) ×1 IMPLANT
NDL SPNL 20GX3.5 QUINCKE YW (NEEDLE) ×1 IMPLANT
NEEDLE HYPO 18GX1.5 SHARP (NEEDLE) ×2
NEEDLE HYPO 22GX1.5 SAFETY (NEEDLE) ×3 IMPLANT
NEEDLE SPNL 20GX3.5 QUINCKE YW (NEEDLE) ×3 IMPLANT
NS IRRIG 500ML POUR BTL (IV SOLUTION) ×3 IMPLANT
PACK ARTHROSCOPY SHOULDER (MISCELLANEOUS) ×3 IMPLANT
PAD WRAPON POLAR SHDR UNIV (MISCELLANEOUS) ×1 IMPLANT
PIN THREADED REVERSE (PIN) ×2 IMPLANT
PULSAVAC PLUS IRRIG FAN TIP (DISPOSABLE) ×3
SCREW BONE LOCKING 4.75X35X3.5 (Screw) ×2 IMPLANT
SCREW BONE LOCKING 4.75X40X3.5 (Screw) ×2 IMPLANT
SCREW CENTRAL 6.5X20MM (Screw) ×2 IMPLANT
SCREW NON-LOCK 4.75MMX15MM (Screw) ×2 IMPLANT
SCREW NON-LOCK 4.75MMX25MMX3.5 (Screw) ×2 IMPLANT
SLING ULTRA II M (MISCELLANEOUS) ×3 IMPLANT
SOL .9 NS 3000ML IRR  AL (IV SOLUTION) ×2
SOL .9 NS 3000ML IRR UROMATIC (IV SOLUTION) ×1 IMPLANT
SPONGE LAP 18X18 RF (DISPOSABLE) ×3 IMPLANT
STAPLER SKIN PROX 35W (STAPLE) ×3 IMPLANT
STEM HUMERAL STRL 13MMX55MM (Stem) ×2 IMPLANT
SUT ETHIBOND 0 MO6 C/R (SUTURE) ×3 IMPLANT
SUT FIBERWIRE #2 38 BLUE 1/2 (SUTURE) ×12
SUT MAXBRAID #2 CVD NDL (SUTURE) ×4 IMPLANT
SUT VIC AB 0 CT1 36 (SUTURE) ×3 IMPLANT
SUT VIC AB 2-0 CT1 27 (SUTURE) ×4
SUT VIC AB 2-0 CT1 TAPERPNT 27 (SUTURE) ×2 IMPLANT
SUTURE FIBERWR #2 38 BLUE 1/2 (SUTURE) ×4 IMPLANT
SYR 10ML LL (SYRINGE) ×3 IMPLANT
SYR 30ML LL (SYRINGE) IMPLANT
TIP FAN IRRIG PULSAVAC PLUS (DISPOSABLE) ×1 IMPLANT
TRAY HUM MINI SHOULDER +0 40D (Shoulder) ×2 IMPLANT
WRAPON POLAR PAD SHDR UNIV (MISCELLANEOUS) ×3

## 2019-06-30 NOTE — Anesthesia Post-op Follow-up Note (Signed)
Anesthesia QCDR form completed.        

## 2019-06-30 NOTE — Evaluation (Signed)
Physical Therapy Evaluation Patient Details Name: Ryan Benjamin MRN: [id number] DOB: 1946-06-08 Today's Date: 06/30/2019   History of Present Illness  Pt is a 73 yo male with PMH that includes arthritis, GERD, and HLD now s/p elective reverse left total shoulder arthroplasty.    Clinical Impression  Pt presents with deficits in strength, LUE ROM, transfers, gait, balance, and activity tolerance but performed very well during the session overall.  Pt was Mod Ind with bed mobility tasks and SBA with transfers.  Pt was able to amb 1 x 30' and then 1 x 200' without an AD with slow cadence and short B step length but was steady without LOB or adverse symptoms.  Pt did require mod verbal cues to scan environment for spatial awareness in order to prevent bumping LUE into obstacles.  Pt will benefit from HHPT services upon discharge to safely address above deficits for decreased caregiver assistance and eventual return to PLOF.       Follow Up Recommendations Home health PT    Equipment Recommendations  3in1 (PT)    Recommendations for Other Services       Precautions / Restrictions Precautions Precautions: Shoulder Shoulder Interventions: Shoulder sling/immobilizer Required Braces or Orthoses: Sling Restrictions Weight Bearing Restrictions: Yes LUE Weight Bearing: Non weight bearing      Mobility  Bed Mobility Overal bed mobility: Modified Independent             General bed mobility comments: Extra time and effort required with sup to sit but no physical assistance needed  Transfers Overall transfer level: Needs assistance Equipment used: None Transfers: Sit to/from Stand Sit to Stand: Supervision         General transfer comment: Good eccentric and concentric control  Ambulation/Gait Ambulation/Gait assistance: Min guard Gait Distance (Feet): 200 Feet x 1, 30 Feet x 1 Assistive device: None Gait Pattern/deviations: Step-through pattern;Decreased step length -  right;Decreased step length - left Gait velocity: decreased   General Gait Details: Decreased B step length and cadence but steady without LOB; pt required mod verbal cues to scan environment to prevent bumping LUE into obstacles  Stairs            Wheelchair Mobility    Modified Rankin (Stroke Patients Only)       Balance Overall balance assessment: No apparent balance deficits (not formally assessed)                                           Pertinent Vitals/Pain Pain Assessment: No/denies pain    Home Living Family/patient expects to be discharged to:: Private residence Living Arrangements: Alone Available Help at Discharge: Family;Available PRN/intermittently Type of Home: House Home Access: Level entry     Home Layout: One level Home Equipment: Cane - single point;Walker - 2 wheels;Shower seat - built in      Prior Function Level of Independence: Independent         Comments: Ind Amb community distances without an AD, no fall history, Ind with ADLs     Hand Dominance        Extremity/Trunk Assessment   Upper Extremity Assessment Upper Extremity Assessment: Defer to OT evaluation;LUE deficits/detail;Generalized weakness LUE: Unable to fully assess due to immobilization LUE Sensation: WNL    Lower Extremity Assessment Lower Extremity Assessment: Overall WFL for tasks assessed       Communication  Communication: No difficulties  Cognition Arousal/Alertness: Awake/alert Behavior During Therapy: WFL for tasks assessed/performed Overall Cognitive Status: Within Functional Limits for tasks assessed                                        General Comments      Exercises     Assessment/Plan    PT Assessment Patient needs continued PT services  PT Problem List Decreased strength;Decreased safety awareness;Decreased range of motion       PT Treatment Interventions DME instruction;Gait training;Stair  training;Functional mobility training;Therapeutic activities;Therapeutic exercise;Balance training;Patient/family education    PT Goals (Current goals can be found in the Care Plan section)  Acute Rehab PT Goals Patient Stated Goal: To return home PT Goal Formulation: With patient Time For Goal Achievement: 07/13/19 Potential to Achieve Goals: Good    Frequency BID   Barriers to discharge        Co-evaluation               AM-PAC PT "6 Clicks" Mobility  Outcome Measure Help needed turning from your back to your side while in a flat bed without using bedrails?: None Help needed moving from lying on your back to sitting on the side of a flat bed without using bedrails?: None Help needed moving to and from a bed to a chair (including a wheelchair)?: A Little Help needed standing up from a chair using your arms (e.g., wheelchair or bedside chair)?: A Little Help needed to walk in hospital room?: A Little Help needed climbing 3-5 steps with a railing? : A Little 6 Click Score: 20    End of Session Equipment Utilized During Treatment: Gait belt Activity Tolerance: Patient tolerated treatment well Patient left: in chair;with chair alarm set;with SCD's reapplied;with call bell/phone within reach Nurse Communication: Mobility status PT Visit Diagnosis: Muscle weakness (generalized) (M62.81)    Time: [id number] PT Time Calculation (min) (ACUTE ONLY): 36 min   Charges:   PT Evaluation $PT Eval Low Complexity: 1 Low PT Treatments $Gait Training: 8-22 mins        D. Elly ModenaScott Aubreanna Percle PT, DPT 06/30/19, 4:16 PM

## 2019-06-30 NOTE — Anesthesia Postprocedure Evaluation (Signed)
Anesthesia Post Note  Patient: Ryan Benjamin  Procedure(s) Performed: REVERSE SHOULDER ARTHROPLASTY (Left Shoulder)  Patient location during evaluation: PACU Anesthesia Type: General Level of consciousness: awake and alert Pain management: pain level controlled Vital Signs Assessment: post-procedure vital signs reviewed and stable Respiratory status: spontaneous breathing, nonlabored ventilation, respiratory function stable and patient connected to nasal cannula oxygen Cardiovascular status: blood pressure returned to baseline and stable Postop Assessment: no apparent nausea or vomiting Anesthetic complications: no     Last Vitals:  Vitals:   06/30/19 1355 06/30/19 1410  BP: 118/79 119/79  Pulse: 80 77  Resp: 16 18  Temp:  36.4 C  SpO2: 94% 95%    Last Pain:  Vitals:   06/30/19 1410  TempSrc:   PainSc: 0-No pain                 Precious Haws [place] Pro

## 2019-06-30 NOTE — Anesthesia Procedure Notes (Signed)
Anesthesia Regional Block: Interscalene brachial plexus block   Pre-Anesthetic Checklist: ,, timeout performed, Correct Patient, Correct Site, Correct Laterality, Correct Procedure, Correct Position, site marked, Risks and benefits discussed,  Surgical consent,  Pre-op evaluation,  At surgeon's request and post-op pain management  Laterality: Upper and Left  Prep: chloraprep       Needles:  Injection technique: Single-shot  Needle Type: Stimiplex     Needle Length: 5cm  Needle Gauge: 22     Additional Needles:   Procedures:,,,, ultrasound used (permanent image in chart),,,,  Narrative:  Start time: 06/30/2019 9:00 AM End time: 06/30/2019 9:05 AM Injection made incrementally with aspirations every 5 mL.  Performed by: Personally  Anesthesiologist: Piscitello, Precious Haws, MD  Additional Notes: Patient consented for risk and benefits of nerve block including but not limited to nerve damage, failed block, bleeding and infection.  Patient voiced understanding.  Functioning IV was confirmed and monitors were applied.  A 33mm 22ga Stimuplex needle was used. Sterile prep,hand hygiene and sterile gloves were used.  Minimal sedation used for procedure.  No paresthesia endorsed by patient during the procedure.  Negative aspiration and negative test dose prior to incremental administration of local anesthetic. The patient tolerated the procedure well with no immediate complications.

## 2019-06-30 NOTE — Transfer of Care (Signed)
Immediate Anesthesia Transfer of Care Note  Patient: Ryan Benjamin  Procedure(s) Performed: REVERSE SHOULDER ARTHROPLASTY (Left Shoulder)  Patient Location: PACU  Anesthesia Type:General  Level of Consciousness: sedated  Airway & Oxygen Therapy: Patient Spontanous Breathing and Patient connected to face mask oxygen  Post-op Assessment: Report given to RN and Post -op Vital signs reviewed and stable  Post vital signs: Reviewed and stable  Last Vitals:  Vitals Value Taken Time  BP 123/78 06/30/19 1310  Temp 36.3 C 06/30/19 1310  Pulse 81 06/30/19 1314  Resp 17 06/30/19 1314  SpO2 99 % 06/30/19 1314  Vitals shown include unvalidated device data.  Last Pain:  Vitals:   06/30/19 1310  TempSrc:   PainSc: Asleep         Complications: No apparent anesthesia complications

## 2019-06-30 NOTE — H&P (Signed)
Paper H&P to be scanned into permanent record. H&P reviewed and patient re-examined. No changes. 

## 2019-06-30 NOTE — Anesthesia Procedure Notes (Signed)
Procedure Name: Intubation Date/Time: 06/30/2019 10:35 AM Performed by: Justus Memory, CRNA Pre-anesthesia Checklist: Patient identified, Patient being monitored, Timeout performed, Emergency Drugs available and Suction available Patient Re-evaluated:Patient Re-evaluated prior to induction Oxygen Delivery Method: Circle system utilized Preoxygenation: Pre-oxygenation with 100% oxygen Induction Type: IV induction Ventilation: Mask ventilation without difficulty Laryngoscope Size: 3 and McGraph Grade View: Grade I Tube type: Oral Tube size: 7.0 mm Number of attempts: 1 Airway Equipment and Method: Stylet Placement Confirmation: ETT inserted through vocal cords under direct vision,  positive ETCO2 and breath sounds checked- equal and bilateral Secured at: 21 cm Tube secured with: Tape Dental Injury: Teeth and Oropharynx as per pre-operative assessment  Difficulty Due To: Difficult Airway- due to anterior larynx Future Recommendations: Recommend- induction with short-acting agent, and alternative techniques readily available

## 2019-06-30 NOTE — Op Note (Signed)
06/30/2019  12:53 PM  Patient:   Ryan Benjamin  Pre-Op Diagnosis:   Massive irreparable rotator cuff tear, left shoulder.  Post-Op Diagnosis:   Same  Procedure:   Reverse left total shoulder arthroplasty.  Surgeon:   Pascal Lux, MD  Assistant:   Cameron Proud, PA-C  Anesthesia:   General endotracheal with an interscalene block using Exparel placed preoperatively by the anesthesiologist.  Findings:   As above.  Complications:   None  EBL:    200 cc  Fluids:   1200 cc crystalloid  UOP:   None  TT:   None  Drains:   None  Closure:   Staples  Implants:   All press-fit Biomet Comprehensive system with a #13 micro-humeral stem, a 40 mm humeral tray with a standard insert, and a mini-base plate with a 40 mm glenosphere.  Brief Clinical Note:   The patient is a 73 year old male with a long history of worsening left shoulder pain. His history and examination are consistent with a large rotator cuff tear. MRI scan confirmed the presence of a large rotator cuff tear that was not felt to have a good likelihood of success with repair. Therefore, the patient presents at this time for a reverse left total shoulder arthroplasty.  Procedure:   The patient underwent placement of an interscalene block using Exparel by the anesthesiologist in the preoperative holding area before being brought into the operating room and lain in the supine position. The patient then underwent general endotracheal intubation and anesthesia before the patient was repositioned in the beach chair position using the beach chair positioner. The left shoulder and upper extremity were prepped with ChloraPrep solution before being draped sterilely. Preoperative antibiotics were administered. A standard anterior approach to the shoulder was made through an approximately 4-5 inch incision. The incision was carried down through the subcutaneous tissues to expose the deltopectoral fascia. The interval between the deltoid and  pectoralis muscles was identified and this plane developed, retracting the cephalic vein laterally with the deltoid muscle. The conjoined tendon was identified. Its lateral margin was dissected and the Kolbel self-retraining retractor inserted. The "three sisters" were identified and cauterized. Bursal tissues were removed to improve visualization. The subscapularis tendon was released from its attachment to the lesser tuberosity 1 cm proximal to its insertion and several tagging sutures placed. The inferior capsule was released with care after identifying and protecting the axillary nerve. The proximal humeral cut was made at approximately 25 of retroversion using the extra-medullary guide.   Attention was redirected to the glenoid. The labrum was debrided circumferentially before the center of the glenoid was marked with electrocautery. The guidewire was drilled into the glenoid neck using the appropriate guide. After verifying its position, it was overreamed with the mini-baseplate reamer to create a flat surface. The permanent mini-baseplate was impacted into place. It was stabilized with a 20 x 6.5 mm central screw and four peripheral screws. Locking screws were placed superiorly and inferiorly while nonlocking screws were placed anteriorly and posteriorly. The permanent 40 mm glenosphere was then impacted into place and its Morse taper locking mechanism verified using manual distraction.  Attention was directed to the humeral side. The humeral canal was reamed sequentially beginning with the end-cutting reamer then progressing from a 4 mm reamer up to a 13 mm reamer. This provided excellent circumferential chatter. The canal was broached beginning with a #11 broach and progressing to a #13 broach. This was left in place and a trial reduction  performed using the standard trial humeral platform. The arm demonstrated excellent range of motion as the hand could be brought across the chest to the opposite  shoulder and brought to the top of the patient's head and to the patient's ear. The shoulder appeared stable throughout this range of motion. The joint was dislocated and the trial components removed. The permanent #13 micro-stem was impacted into place with care taken to maintain the appropriate version. The permanent 40 mm humeral platform with the standard insert was put together on the back table and impacted into place. Again, the [facility] taper locking mechanism was verified using manual distraction. The shoulder was relocated using two finger pressure and again placed through a range of motion with the findings as described above.  The wound was copiously irrigated with bacitracin saline solution using the jet lavage system before a total of 20 cc of Exparel diluted out to 60 cc with normal saline and 30 cc of 0.5% Sensorcaine with epinephrine was injected into the pericapsular and peri-incisional tissues to help with postoperative analgesia. The subscapularis tendon was reapproximated using #2 FiberWire interrupted sutures. The deltopectoral interval was closed using #0 Vicryl interrupted sutures before the subcutaneous tissues were closed using 2-0 Vicryl interrupted sutures. The skin was closed using staples. Prior to closing the skin, 1 g of transexemic acid in 10 cc of normal saline was injected intra-articularly to help with postoperative bleeding. A sterile occlusive dressing was applied to the wound before the arm was placed into a shoulder immobilizer with an abduction pillow. A Polar Care system also was applied to the shoulder. The patient was then transferred back to a hospital bed before being awakened, extubated, and returned to the recovery room in satisfactory condition after tolerating the procedure well.

## 2019-06-30 NOTE — Anesthesia Preprocedure Evaluation (Signed)
Anesthesia Evaluation  Patient identified by MRN, date of birth, ID band Patient awake    Reviewed: Allergy & Precautions, H&P , NPO status , Patient's Chart, lab work & pertinent test results  History of Anesthesia Complications Negative for: history of anesthetic complications  Airway Mallampati: III  TM Distance: <3 FB Neck ROM: full    Dental  (+) Chipped   Pulmonary neg pulmonary ROS, neg shortness of breath,           Cardiovascular Exercise Tolerance: Good (-) angina(-) Past MI and (-) DOE negative cardio ROS       Neuro/Psych negative neurological ROS  negative psych ROS   GI/Hepatic Neg liver ROS, GERD  Medicated and Controlled,  Endo/Other  negative endocrine ROS  Renal/GU Renal disease     Musculoskeletal   Abdominal   Peds  Hematology negative hematology ROS (+)   Anesthesia Other Findings Past Medical History: No date: Arthritis No date: GERD (gastroesophageal reflux disease) No date: Hyperlipemia  Past Surgical History: No date: RECONSTRUCTION OF EYELID  BMI    Body Mass Index: 33.38 kg/m      Reproductive/Obstetrics negative OB ROS                             Anesthesia Physical Anesthesia Plan  ASA: II  Anesthesia Plan: General ETT   Post-op Pain Management: GA combined w/ Regional for post-op pain   Induction: Intravenous  PONV Risk Score and Plan: Ondansetron, Dexamethasone, Midazolam and Treatment may vary due to age or medical condition  Airway Management Planned: Oral ETT and Video Laryngoscope Planned  Additional Equipment:   Intra-op Plan:   Post-operative Plan: Extubation in OR  Informed Consent: I have reviewed the patients History and Physical, chart, labs and discussed the procedure including the risks, benefits and alternatives for the proposed anesthesia with the patient or authorized representative who has indicated his/her  understanding and acceptance.     Dental Advisory Given  Plan Discussed with: Anesthesiologist, CRNA and Surgeon  Anesthesia Plan Comments: (Patient consented for risks of anesthesia including but not limited to:  - adverse reactions to medications - damage to teeth, lips or other oral mucosa - sore throat or hoarseness - Damage to heart, brain, lungs or loss of life  Patient voiced understanding.)        Anesthesia Quick Evaluation

## 2019-07-01 LAB — CBC WITH DIFFERENTIAL/PLATELET
Abs Immature Granulocytes: 0.06 10*3/uL (ref 0.00–0.07)
Basophils Absolute: 0 10*3/uL (ref 0.0–0.1)
Basophils Relative: 0 %
Eosinophils Absolute: 0 10*3/uL (ref 0.0–0.5)
Eosinophils Relative: 0 %
HCT: 40.3 % (ref 39.0–52.0)
Hemoglobin: 13.4 g/dL (ref 13.0–17.0)
Immature Granulocytes: 0 %
Lymphocytes Relative: 7 %
Lymphs Abs: 1 10*3/uL (ref 0.7–4.0)
MCH: 30.5 pg (ref 26.0–34.0)
MCHC: 33.3 g/dL (ref 30.0–36.0)
MCV: 91.8 fL (ref 80.0–100.0)
Monocytes Absolute: 1.1 10*3/uL — ABNORMAL HIGH (ref 0.1–1.0)
Monocytes Relative: 8 %
Neutro Abs: 11.4 10*3/uL — ABNORMAL HIGH (ref 1.7–7.7)
Neutrophils Relative %: 85 %
Platelets: 179 10*3/uL (ref 150–400)
RBC: 4.39 MIL/uL (ref 4.22–5.81)
RDW: 12.8 % (ref 11.5–15.5)
WBC: 13.6 10*3/uL — ABNORMAL HIGH (ref 4.0–10.5)
nRBC: 0 % (ref 0.0–0.2)

## 2019-07-01 LAB — BASIC METABOLIC PANEL
Anion gap: 9 (ref 5–15)
BUN: 14 mg/dL (ref 8–23)
CO2: 23 mmol/L (ref 22–32)
Calcium: 8.6 mg/dL — ABNORMAL LOW (ref 8.9–10.3)
Chloride: 105 mmol/L (ref 98–111)
Creatinine, Ser: 0.67 mg/dL (ref 0.61–1.24)
GFR calc Af Amer: >60 mL/min (ref >60)
GFR calc non Af Amer: >60 mL/min (ref >60)
Glucose, Bld: 119 mg/dL — ABNORMAL HIGH (ref 70–99)
Potassium: 4.1 mmol/L (ref 3.5–5.1)
Sodium: 137 mmol/L (ref 135–145)

## 2019-07-01 MED ORDER — OXYCODONE HCL 5 MG PO TABS: 5.0000 mg | ORAL_TABLET | ORAL | 0 refills | Status: DC | PRN

## 2019-07-01 MED ORDER — TRAMADOL HCL 50 MG PO TABS: 50.0000 mg | ORAL_TABLET | Freq: Four times a day (QID) | ORAL | 0 refills | Status: DC | PRN

## 2019-07-01 MED ORDER — ASPIRIN EC 325 MG PO TBEC: 325.0000 mg | DELAYED_RELEASE_TABLET | Freq: Every day | ORAL | 0 refills | Status: DC

## 2019-07-01 NOTE — Discharge Instructions (Signed)
Diet: As you were doing prior to hospitalization  ° °Shower:  May shower but keep the wounds dry, use an occlusive plastic wrap, NO SOAKING IN TUB.  If the bandage gets wet, change with a clean dry gauze. ° °Dressing:  You may change your dressing as needed. Change the dressing with sterile gauze dressing.   ° °Activity:  Increase activity slowly as tolerated, but follow the weight bearing instructions below.  No lifting or driving for 6 weeks. ° °Weight Bearing:  Non-weightbearing to the left arm. ° °Blood Clot Prevention: Take 1 325mg aspirin daily for 14 days. ° °To prevent constipation: you may use a stool softener such as - ° °Colace (over the counter) 100 mg by mouth twice a day  °Drink plenty of fluids (prune juice may be helpful) and high fiber foods °Miralax (over the counter) for constipation as needed.   ° °Itching:  If you experience itching with your medications, try taking only a single pain pill, or even half a pain pill at a time.  You may take up to 10 pain pills per day, and you can also use benadryl over the counter for itching or also to help with sleep.  ° °Precautions:  If you experience chest pain or shortness of breath - call 911 immediately for transfer to the hospital emergency department!! ° °If you develop a fever greater that 101 F, purulent drainage from wound, increased redness or drainage from wound, or calf pain-Call [facility]                                              °Follow- Up Appointment:  Please call for an appointment to be seen in 2 weeks at [facility] °

## 2019-07-01 NOTE — Evaluation (Signed)
Occupational Therapy Evaluation Patient Details Name: Ryan Benjamin MRN: [id number] DOB: 04-10-1946 Today's Date: 07/01/2019    History of Present Illness Pt is a 73 yo male with PMH that includes arthritis, GERD, and HLD now s/p elective reverse left total shoulder arthroplasty.   Clinical Impression   Ryan Benjamin was seen for an OT evaluation this date POD #1 from above surgery. Pt lives alone in a one level home with a level entrance. Prior to surgery, pt was active and independent with no falls history in the past 12 months. Pt lives alone and has limited assistance in the home. Pt has orders for LUE to be immobilized and will be NWBing per MD. Patient presents with impaired strength/ROM, and sensation to LUE with block not completely resolved yet. These impairments result in a decreased ability to perform self care tasks requiring mod assist for UB/LB dressing and bathing and max assist for application of polar care, compression stockings, and sling/immobilizer. Pt instructed in polar care mgt, compression stockings mgt, sling/immobilizer mgt, LUE precautions, adaptive strategies for bathing/dressing/toileting/grooming, positioning and considerations for sleep, and home/routines modifications to maximize falls prevention, safety, and independence. Handout provided. OT adjusted sling/immobilizer and polar care to improve comfort, optimize positioning, and to maximize skin integrity/safety. Pt verbalized understanding of all education/training provided. Pt will benefit from skilled OT services to address these limitations and improve independence in daily tasks. Recommend HHOT services to continue therapy to maximize return to PLOF, address home/routines modifications and safety, & minimize falls risk.       Follow Up Recommendations  Home health OT    Equipment Recommendations  3 in 1 bedside commode    Recommendations for Other Services       Precautions / Restrictions  Precautions Precautions: Shoulder Shoulder Interventions: Shoulder sling/immobilizer;Shoulder abduction pillow Precaution Booklet Issued: No Required Braces or Orthoses: Sling Restrictions Weight Bearing Restrictions: Yes LUE Weight Bearing: Non weight bearing      Mobility Bed Mobility Overal bed mobility: Modified Independent             General bed mobility comments: Extra time and effort required with sup to sit but no physical assistance needed  Transfers Overall transfer level: Needs assistance Equipment used: None Transfers: Sit to/from Stand Sit to Stand: Supervision         General transfer comment: Demonstrated good safety awareness. Pushes up from bed with RUE during STS.    Balance Overall balance assessment: No apparent balance deficits (not formally assessed)                                         ADL either performed or assessed with clinical judgement   ADL Overall ADL's : Needs assistance/impaired                                       General ADL Comments: Pt req. mod/max assist for UB ADL tasks including max assist to don/doff polar care and sling. Pt req. min assist for LB dressing on this date with regular VCs for shoulder precautions with block not fully resolved.     Vision Baseline Vision/History: Wears glasses Wears Glasses: At all times Patient Visual Report: No change from baseline       Perception     Praxis  Pertinent Vitals/Pain Pain Assessment: No/denies pain     Hand Dominance Right   Extremity/Trunk Assessment Upper Extremity Assessment Upper Extremity Assessment: LUE deficits/detail;RUE deficits/detail RUE Deficits / Details: WNL LUE Deficits / Details: s/p L Rev. TSA LUE: Unable to fully assess due to immobilization LUE Coordination: decreased gross motor;decreased fine motor   Lower Extremity Assessment Lower Extremity Assessment: Defer to PT evaluation;Overall [facility] for tasks  assessed   Cervical / Trunk Assessment Cervical / Trunk Assessment: Normal   Communication Communication Communication: No difficulties   Cognition Arousal/Alertness: Awake/alert Behavior During Therapy: WFL for tasks assessed/performed Overall Cognitive Status: Within Functional Limits for tasks assessed                                     General Comments       Exercises Other Exercises Other Exercises: Pt educated on falls prevention strategies, polar care mgt, compression stocking mgt, sling/polar care mgt, routines modifications, & compensatory dressing strategies on this date. Handout left.   Shoulder Instructions      Home Living Family/patient expects to be discharged to:: Private residence Living Arrangements: Alone Available Help at Discharge: Family;Available PRN/intermittently Type of Home: House Home Access: Level entry     Home Layout: One level     Bathroom Shower/Tub: Walk-in shower         Home Equipment: Cane - single point;Walker - 2 wheels;Shower seat - built in          Prior Functioning/Environment Level of Independence: Independent        Comments: Ind Amb community distances without an AD, no fall history, Ind with ADLs        OT Problem List: Decreased strength;Decreased coordination;Impaired UE functional use;Decreased knowledge of use of DME or AE;Decreased safety awareness;Decreased range of motion      OT Treatment/Interventions: Self-care/ADL training;Therapeutic exercise;Therapeutic activities;DME and/or AE instruction;Patient/family education    OT Goals(Current goals can be found in the care plan section) Acute Rehab OT Goals Patient Stated Goal: To return home OT Goal Formulation: With patient Time For Goal Achievement: 07/15/19 Potential to Achieve Goals: Good ADL Goals Pt Will Perform Upper Body Dressing: with min assist;with adaptive equipment;sitting(With LRAD PRN for Safety) Pt Will Transfer to  Toilet: ambulating;with modified independence(With LRAD PRN for Safety) Pt Will Perform Toileting - Clothing Manipulation and hygiene: sit to/from stand;with supervision(With LRAD PRN for Safety)  OT Frequency: Min 1X/week   Barriers to D/C: Decreased caregiver support  Pt lives alone, states he will have limited help upon hospital DC.       Co-evaluation              AM-PAC OT "6 Clicks" Daily Activity     Outcome Measure Help from another person eating meals?: None Help from another person taking care of personal grooming?: A Little Help from another person toileting, which includes using toliet, bedpan, or urinal?: A Little Help from another person bathing (including washing, rinsing, drying)?: A Lot Help from another person to put on and taking off regular upper body clothing?: A Lot Help from another person to put on and taking off regular lower body clothing?: A Little 6 Click Score: 17   End of Session Equipment Utilized During Treatment: Gait belt  Activity Tolerance: Patient tolerated treatment well;No increased pain Patient left: in chair;with call bell/phone within reach;with chair alarm set  OT Visit Diagnosis: Other abnormalities of gait and  mobility (R26.89)                Time: [id number] OT Time Calculation (min): 39 min Charges:  OT General Charges $OT Visit: 1 Visit OT Evaluation $OT Eval Low Complexity: 1 Low OT Treatments $Self Care/Home Management : 23-37 mins  Rockney GheeSerenity Rahcel Shutes, M.S., OTR/L Ascom: [phone] 07/01/19, 9:11 AM

## 2019-07-01 NOTE — Progress Notes (Signed)
  Subjective: 1 Day Post-Op Procedure(s) (LRB): REVERSE SHOULDER ARTHROPLASTY (Left) Patient reports pain as 0 on 0-10 scale.   Patient is well, and has had no acute complaints or problems Plan is to go Home after hospital stay. Negative for chest pain and shortness of breath Fever: no Gastrointestinal:Negative for nausea and vomiting  Objective: Vital signs in last 24 hours: Temp:  [97.3 F (36.3 C)-98.6 F (37 C)] 98 F (36.7 C) (07/29 0747) Pulse Rate:  [56-87] 83 (07/29 0747) Resp:  [11-22] 18 (07/29 0747) BP: (109-195)/(72-103) 131/103 (07/29 0747) SpO2:  [93 %-100 %] 96 % (07/29 0747) Weight:  [96.7 kg] 96.7 kg (07/28 0811)  Intake/Output from previous day:  Intake/Output Summary (Last 24 hours) at 07/01/2019 0748 Last data filed at 07/01/2019 0744 Gross per 24 hour  Intake 2525.71 ml  Output 1640 ml  Net 885.71 ml    Intake/Output this shift: No intake/output data recorded.  Labs: Recent Labs    07/01/19 0310  HGB 13.4   Recent Labs    07/01/19 0310  WBC 13.6*  RBC 4.39  HCT 40.3  PLT 179   Recent Labs    07/01/19 0310  NA 137  K 4.1  CL 105  CO2 23  BUN 14  CREATININE 0.67  GLUCOSE 119*  CALCIUM 8.6*   No results for input(s): LABPT, INR in the last 72 hours.   EXAM General - Patient is Alert, Appropriate and Oriented Extremity - ABD soft Intact pulses distally Dorsiflexion/Plantar flexion intact Incision: dressing C/D/I  Block still working to the left shoulder, decreased sensation to light touch. Dressing/Incision - clean, dry, no drainage Motor Function - intact, moving foot and toes well on exam.    Past Medical History:  Diagnosis Date  . Arthritis   . GERD (gastroesophageal reflux disease)   . Hyperlipemia     Assessment/Plan: 1 Day Post-Op Procedure(s) (LRB): REVERSE SHOULDER ARTHROPLASTY (Left) Active Problems:   Status post reverse arthroplasty of shoulder, left  Estimated body mass index is 33.38 kg/m as  calculated from the following:   Height as of this encounter: 5\' 7"  (1.702 m).   Weight as of this encounter: 96.7 kg. Advance diet Up with therapy D/C IV fluids when tolerating po intake.  Labs reviewed this AM.   Up with therapy today, was able to walk 200 feet yesterday. Plan for d/c home this afternoon.  DVT Prophylaxis - Lovenox, Foot Pumps and TED hose Non-weightbearing to the left arm.  Raquel , PA-C [facility] 07/01/2019, 7:48 AM

## 2019-07-01 NOTE — Progress Notes (Signed)
Physical Therapy Treatment Patient Details Name: Ryan Benjamin MRN: [id number] DOB: [birth year] Today's Date: 07/01/2019    History of Present Illness Pt is a 73 yo male with PMH that includes arthritis, GERD, and HLD now s/p elective reverse left total shoulder arthroplasty.    PT Comments    Pt presents with deficits in LUE strength and ROM and with min deficits with transfers and gait but is progressing very well towards goals.  Pt was SBA with transfers demonstrating good eccentric and concentric control from various height surfaces.  Pt was able to amb 200' with SBA without an AD with improved cadence and awareness of surroundings to avoid bumping LUE into obstacles.  Pt was steady ascending and descending steps with one rail.  Pt will benefit from HHPT services upon discharge to safely address above deficits for decreased caregiver assistance and eventual return to PLOF.     Follow Up Recommendations  Home health PT     Equipment Recommendations  3in1 (PT)    Recommendations for Other Services       Precautions / Restrictions Precautions Precautions: Shoulder Shoulder Interventions: Shoulder sling/immobilizer;Shoulder abduction pillow Precaution Booklet Issued: No Required Braces or Orthoses: Sling Restrictions Weight Bearing Restrictions: Yes LUE Weight Bearing: Non weight bearing    Mobility  Bed Mobility Overal bed mobility: Modified Independent             General bed mobility comments: Extra time and effort required with sup to sit but no physical assistance needed  Transfers Overall transfer level: Needs assistance Equipment used: None Transfers: Sit to/from Stand Sit to Stand: Supervision         General transfer comment: Good eccentric and concentric control during sit to/from stand training from multiple height surfaces  Ambulation/Gait Ambulation/Gait assistance: Supervision Gait Distance (Feet): 200 Feet Assistive device: None Gait  Pattern/deviations: Step-through pattern;Decreased step length - right;Decreased step length - left Gait velocity: decreased   General Gait Details: Min verbal cues to scan environment to prevent bumping LUE into obstacles with pt presenting with improved awareness and general safety during ambulation   Stairs Stairs: Yes Stairs assistance: Supervision Stair Management: One rail Right Number of Stairs: 4 General stair comments: Good stability and control ascending and descending stairs   Wheelchair Mobility    Modified Rankin (Stroke Patients Only)       Balance Overall balance assessment: No apparent balance deficits (not formally assessed)                                          Cognition Arousal/Alertness: Awake/alert Behavior During Therapy: WFL for tasks assessed/performed Overall Cognitive Status: Within Functional Limits for tasks assessed                                        Exercises Other Exercises Other Exercises: Pt education on safe car transfer sequencing with practice using room chair to simulate car transfers Other Exercises: Pt education on safe management of functional tasks at home and on utilizing friends/family as needed for safety    General Comments        Pertinent Vitals/Pain Pain Assessment: No/denies pain    Home Living Family/patient expects to be discharged to:: Private residence Living Arrangements: Alone Available Help at Discharge: Family;Available PRN/intermittently Type of Home: House  Home Access: Level entry   Home Layout: One level Home Equipment: Cane - single point;Walker - 2 wheels;Shower seat - built in      Prior Function Level of Independence: Independent      Comments: Ind Amb community distances without an AD, no fall history, Ind with ADLs   PT Goals (current goals can now be found in the care plan section) Acute Rehab PT Goals Patient Stated Goal: To return home Progress  towards PT goals: Progressing toward goals    Frequency    BID      PT Plan Current plan remains appropriate    Co-evaluation              AM-PAC PT "6 Clicks" Mobility   Outcome Measure  Help needed turning from your back to your side while in a flat bed without using bedrails?: None Help needed moving from lying on your back to sitting on the side of a flat bed without using bedrails?: None Help needed moving to and from a bed to a chair (including a wheelchair)?: A Little Help needed standing up from a chair using your arms (e.g., wheelchair or bedside chair)?: A Little Help needed to walk in hospital room?: A Little Help needed climbing 3-5 steps with a railing? : A Little 6 Click Score: 20    End of Session Equipment Utilized During Treatment: Gait belt Activity Tolerance: Patient tolerated treatment well Patient left: in chair;with call bell/phone within reach Nurse Communication: Mobility status PT Visit Diagnosis: Muscle weakness (generalized) (M62.81)     Time: 1036-1100 PT Time Calculation (min) (ACUTE ONLY): 24 min  Charges:  $Gait Training: 8-22 mins $Therapeutic Activity: 8-22 mins                     D. Scott Teri Diltz PT, DPT 07/01/19, 11:18 AM

## 2019-07-01 NOTE — Progress Notes (Signed)
Discharge instructions and medication regimen reviewed at bedside with patient. Pt. verbalizes understanding of instructions and medication regimen. Prescriptions with pt. Patient assessment unchanged from this morning. IV discontinued per policy. Pt family member will come to pick up pt [facility] mall.

## 2019-07-01 NOTE — Discharge Summary (Signed)
Physician Discharge Summary  Patient ID: Ryan Benjamin MRN: [id number] DOB/AGE: January 05, 1946 73 y.o.  Admit date: 06/30/2019 Discharge date: 07/01/2019  Admission Diagnoses:  ROTATOR CUFF TENDINITIS, LEFT. COMPLETE TEAR OF LEFT ROTATOR CUFF.  Discharge Diagnoses: Patient Active Problem List   Diagnosis Date Noted  . Status post reverse arthroplasty of shoulder, left 06/30/2019  . Elevated PSA 04/16/2019  . Benign prostatic hyperplasia with lower urinary tract symptoms 04/16/2019  . Nephrolithiasis 04/16/2019  . Bacteriuria, chronic 04/16/2019    Past Medical History:  Diagnosis Date  . Arthritis   . GERD (gastroesophageal reflux disease)   . Hyperlipemia      Transfusion: None.   Consultants (if any):   Discharged Condition: Improved  Hospital Course: Ryan Benjamin is an 73 y.o. 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    Surgeries: Procedure(s): REVERSE SHOULDER ARTHROPLASTY on 06/30/2019 Patient tolerated the surgery well. Taken to PACU where she was stabilized and then transferred to the orthopedic floor.  Started on Lovenox 40mg  q 24 hrs. Foot pumps applied bilaterally at 80 mm. Heels elevated on bed with rolled towels. No evidence of DVT. Negative Homan. Physical therapy started on day #1 for gait training and transfer. OT started day #1 for ADL and assisted devices.  Patient's IV was d/c on POD1.  Implants: All press-fit [employer] Comprehensive system with a #13 micro-humeral stem, a 40 mm humeral tray with a standard insert, and a mini-base plate with a 40 mm glenosphere.  He was given perioperative antibiotics:  Anti-infectives (From admission, onward)   Start     Dose/Rate Route Frequency Ordered Stop   06/30/19 1600  ceFAZolin (ANCEF) IVPB 2g/100 mL premix     2 g 200 mL/hr over 30 Minutes Intravenous Every  6 hours 06/30/19 1435 07/01/19 0530   06/30/19 1445  sulfamethoxazole-trimethoprim (BACTRIM DS) 800-160 MG per tablet 1 tablet    Note to Pharmacy: For UTI     1 tablet Oral 2 times daily 06/30/19 1435     06/30/19 0759  ceFAZolin (ANCEF) 2-4 GM/100ML-% IVPB    Note to Pharmacy: Milinda Cave   : cabinet override      06/30/19 0759 06/30/19 1039   06/29/19 2215  ceFAZolin (ANCEF) IVPB 2g/100 mL premix     2 g 200 mL/hr over 30 Minutes Intravenous  Once 06/29/19 2212 06/30/19 1044    .  He was given sequential compression devices, early ambulation, and Lovenox for DVT prophylaxis.  He benefited maximally from the hospital stay and there were no complications.    Recent vital signs:  Vitals:   07/01/19 0403 07/01/19 0747  BP: 109/83 (!) 131/103  Pulse: 67 83  Resp: 18 18  Temp: 98 F (36.7 C) 98 F (36.7 C)  SpO2: 97% 96%    Recent laboratory studies:  Lab Results  Component Value Date   HGB 13.4 07/01/2019   HGB 15.1 06/24/2019   HGB 16.4 03/12/2015   Lab Results  Component Value Date   WBC 13.6 (H) 07/01/2019   PLT 179 07/01/2019   No results found for: INR Lab Results  Component Value Date   NA 137 07/01/2019   K 4.1 07/01/2019   CL 105 07/01/2019   CO2 23 07/01/2019   BUN 14 07/01/2019   CREATININE 0.67 07/01/2019   GLUCOSE 119 (H) 07/01/2019  Discharge Medications:   Allergies as of 07/01/2019   No Known Allergies     Medication List    TAKE these medications   acetaminophen 650 MG CR tablet Commonly known as: TYLENOL Take 1,300 mg by mouth daily.   aspirin EC 325 MG tablet Take 1 tablet (325 mg total) by mouth daily. What changed:   medication strength  how much to take  when to take this   atorvastatin 20 MG tablet Commonly known as: LIPITOR Take 20 mg by mouth at bedtime.   esomeprazole 20 MG capsule Commonly known as: NEXIUM Take 20 mg by mouth daily.   loratadine 10 MG tablet Commonly known as: CLARITIN Take 10 mg by  mouth at bedtime.   LUBRICATING EYE DROPS OP Place 1 drop into both eyes daily as needed (dry eyes).   oxyCODONE 5 MG immediate release tablet Commonly known as: Oxy IR/ROXICODONE Take 1-2 tablets (5-10 mg total) by mouth every 4 (four) hours as needed for moderate pain.   oxymetazoline 0.05 % nasal spray Commonly known as: AFRIN Place 1 spray into both nostrils 2 (two) times daily as needed for congestion.   sulfamethoxazole-trimethoprim 800-160 MG tablet Commonly known as: BACTRIM DS Take 1 tablet by mouth 2 (two) times daily. For UTI   tamsulosin 0.4 MG Caps capsule Commonly known as: FLOMAX Take 1 capsule (0.4 mg total) by mouth daily.   traMADol 50 MG tablet Commonly known as: ULTRAM Take 1 tablet (50 mg total) by mouth every 6 (six) hours as needed for moderate pain.   Viagra 100 MG tablet Generic drug: sildenafil Take 100 mg by mouth daily as needed for erectile dysfunction.      Diagnostic Studies: Dg Shoulder Left Port  Result Date: 06/30/2019 CLINICAL DATA:  S/p reverse left shoulder arthroplasty. EXAM: LEFT SHOULDER - 1 VIEW COMPARISON:  None. FINDINGS: 2 submitted images show the reverse left shoulder arthroplasty, which appears well-seated and well aligned. There is no acute fracture or evidence of an operative complication. IMPRESSION: Well-positioned left shoulder arthroplasty. Electronically Signed   By: Amie Portlandavid  Ormond M.D.   On: 06/30/2019 15:41   Disposition: Plan for d/c home this afternoon pending progress with PT today.  Follow-up Information    Anson OregonMcGhee, James Lance, PA-C Follow up in 14 day(s).   Specialty: Physician Assistant Why: Mindi SlickerStaple Removal. Contact information: [address] [facility] [place] [id number] [phone]          Signed: Meriel PicaJames L McGhee PA-C 07/01/2019, 7:53 AM

## 2019-07-01 NOTE — TOC Transition Note (Signed)
Transition of Care [name]) - [name] Discharge Note   Patient Details  Name: Ryan Benjamin MRN: [id number] Date of Birth: January 18, 1946  Transition of [facility]) [name] Contact:  Ryan Hilt, RN Phone Number: 07/01/2019, 8:22 AM   Clinical Narrative:    Met with the patient to discuss DC plan and needs He lives alone He walks unassisted [facility] and [facility], I notified Ryan Benjamin He would benefit from a 3 in 1 and I notified Ryan Benjamin He sees Dr Ryan Benjamin as PCP and is up to date He uses Ryan Benjamin for Pharmacy and can afford his medications No further needs   Final next level of care: Home w Home Health Services Barriers to Discharge: Barriers Resolved   Patient Goals and CMS Choice Patient states their goals for this hospitalization and ongoing recovery are:: go home CMS Medicare.gov Compare Post Acute Care list provided to:: Patient Choice offered to / list presented to : Patient  Discharge Placement                       Discharge Plan and Services   Discharge Planning Services: CM Consult Post Acute Care Choice: Home Health          DME Arranged: 3-N-1 DME Agency: [name] Date DME Agency Contacted: 07/01/19 Time DME Agency Contacted: [phone] Representative spoke with at DME Agency: [name]: PT, OT [place] Agency: [place] ([place]) Date [place]: 07/01/19 Time [place]: [phone] Representative spoke with at [place]: [place] ([place]) Interventions     Readmission Risk Interventions No flowsheet data found.

## 2019-07-03 LAB — SURGICAL PATHOLOGY

## 2019-08-05 IMAGING — MR MR PROSTATE WO/W CM
56 series · 56 of 56 positions shown · IV contrast (Gadavist)
Comparison: 03/12/2015 abdominopelvic CT.

CLINICAL DATA: Elevated PSA at 6.7.

EXAM:
MR PROSTATE WITHOUT AND WITH CONTRAST
TECHNIQUE: Multiplanar multisequence MRI images were obtained of the pelvis
centered about the prostate. Pre and post contrast images were
obtained.
CONTRAST:  10 cc Gadavist

[Series 3: T1 · axial · 8.0mm · 0.74mm/px · 1 of 25 slices shown (1 of 48)]
[im 1/25]
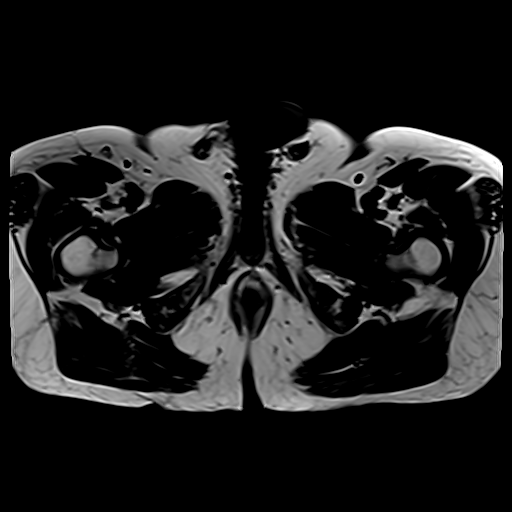

[Series 4: bSSFP fat-sat · axial · 8.0mm · 0.74mm/px · 1 of 25 slices shown]
[im 1/25]
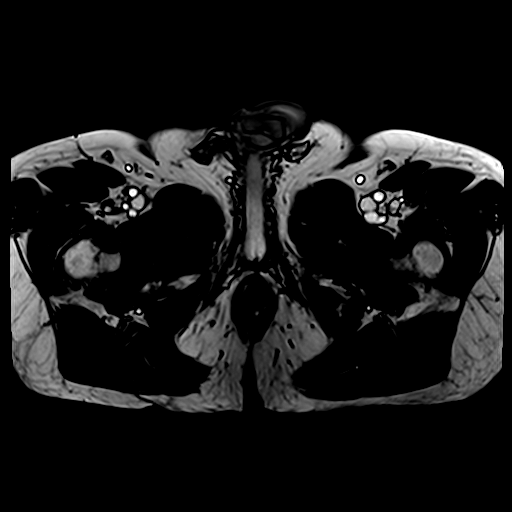

[Series 5: T2 · coronal · 3.0mm · 0.70mm/px · 1 of 30 slices shown (1 of 3)]
[im 1/30]
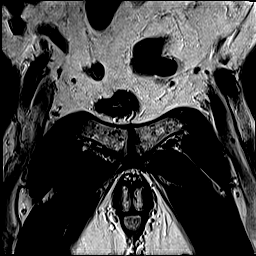

[Series 6: T2 · sagittal · 3.5mm · 0.62mm/px · 1 of 35 slices shown (2 of 3)]
[im 1/35]
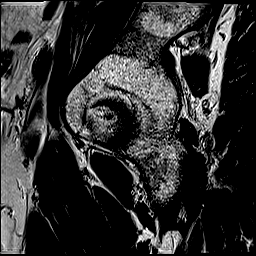

[Series 7: T1 · axial · 3.0mm · 0.35mm/px · 1 of 30 slices shown (2 of 48)]
[im 1/30]
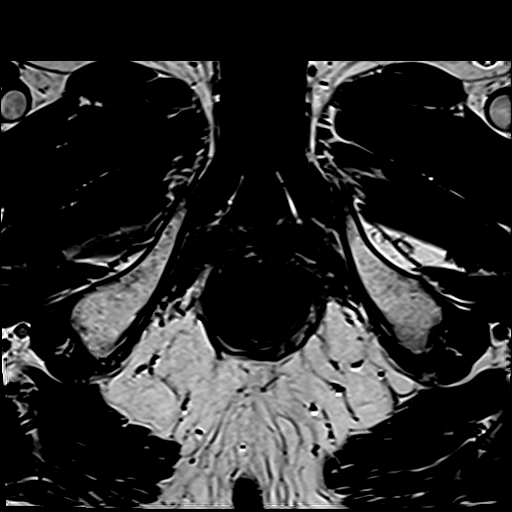

[Series 8: T2 · axial · 3.5mm · 0.56mm/px · 1 of 23 slices shown (3 of 3)]
[im 1/23]
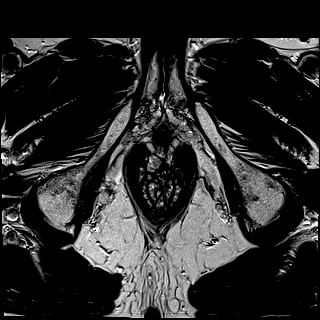

[Series 9: ax dwi_tracew · axial · 3.0mm · 0.78mm/px · 1 of 75 slices shown]
[im 1/75]
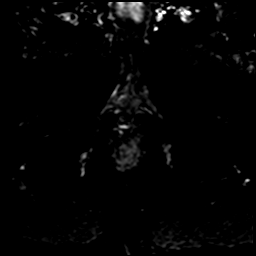

[Series 10: ax dwi_adc · axial · 3.0mm · 0.78mm/px · 1 of 25 slices shown]
[im 1/25]
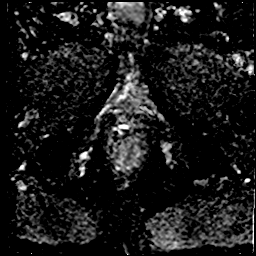

[Series 11: ax dwi_calc_bval · axial · 3.0mm · 0.78mm/px · 1 of 25 slices shown]
[im 1/25]
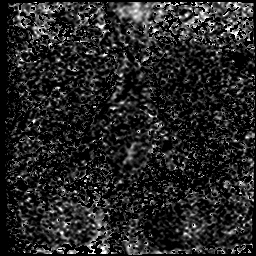

[Series 12: t2_space_tra_p2_288 · axial · 1.0mm · 1.04mm/px · 1 of 72 slices shown]
[im 1/72]
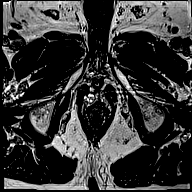

[Series 13: T1 · axial · 3.0mm · 1.15mm/px · 1 of 28 slices shown (3 of 48)]
[im 1/28]
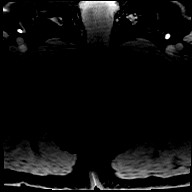

[Series 14: T1 · axial · 3.0mm · 1.15mm/px · 1 of 28 slices shown (4 of 48)]
[im 1/28]
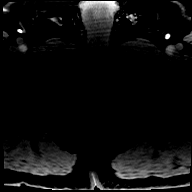

[Series 15: T1 · axial · 3.0mm · 1.15mm/px · 1 of 28 slices shown (5 of 48)]
[im 1/28]
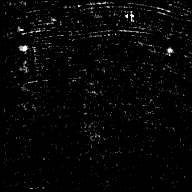

[Series 16: T1 · axial · 3.0mm · 1.15mm/px · 1 of 28 slices shown (6 of 48)]
[im 1/28]
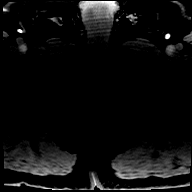

[Series 17: T1 · axial · 3.0mm · 1.15mm/px · 1 of 28 slices shown (7 of 48)]
[im 1/28]
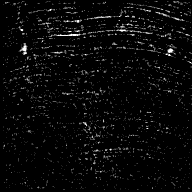

[Series 18: T1 · axial · 3.0mm · 1.15mm/px · 1 of 28 slices shown (8 of 48)]
[im 1/28]
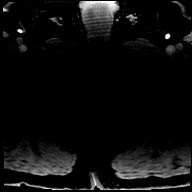

[Series 19: T1 · axial · 3.0mm · 1.15mm/px · 1 of 28 slices shown (9 of 48)]
[im 1/28]
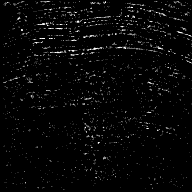

[Series 20: T1 · axial · 3.0mm · 1.15mm/px · 1 of 28 slices shown (10 of 48)]
[im 1/28]
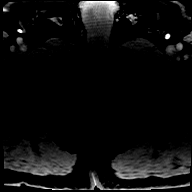

[Series 21: T1 · axial · 3.0mm · 1.15mm/px · 1 of 28 slices shown (11 of 48)]
[im 1/28]
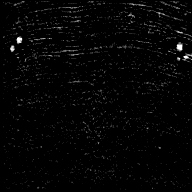

[Series 22: T1 · axial · 3.0mm · 1.15mm/px · 1 of 28 slices shown (12 of 48)]
[im 1/28]
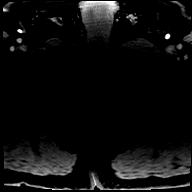

[Series 23: T1 · axial · 3.0mm · 1.15mm/px · 1 of 28 slices shown (13 of 48)]
[im 1/28]
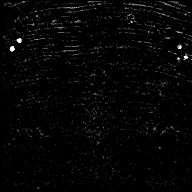

[Series 24: T1 · axial · 3.0mm · 1.15mm/px · 1 of 28 slices shown (14 of 48)]
[im 1/28]
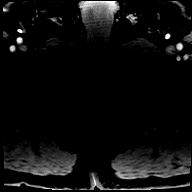

[Series 25: T1 · axial · 3.0mm · 1.15mm/px · 1 of 28 slices shown (15 of 48)]
[im 1/28]
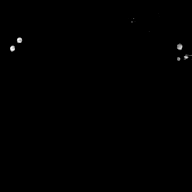

[Series 26: T1 · axial · 3.0mm · 1.15mm/px · 1 of 28 slices shown (16 of 48)]
[im 1/28]
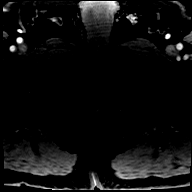

[Series 27: T1 · axial · 3.0mm · 1.15mm/px · 1 of 28 slices shown (17 of 48)]
[im 1/28]
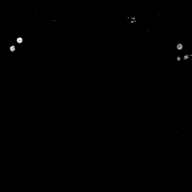

[Series 28: T1 · axial · 3.0mm · 1.15mm/px · 1 of 28 slices shown (18 of 48)]
[im 1/28]
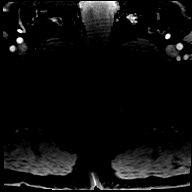

[Series 29: T1 · axial · 3.0mm · 1.15mm/px · 1 of 28 slices shown (19 of 48)]
[im 1/28]
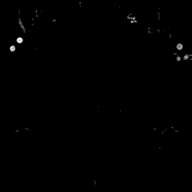

[Series 30: T1 · axial · 3.0mm · 1.15mm/px · 1 of 28 slices shown (20 of 48)]
[im 1/28]
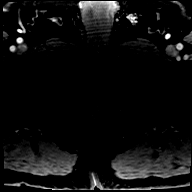

[Series 31: T1 · axial · 3.0mm · 1.15mm/px · 1 of 28 slices shown (21 of 48)]
[im 1/28]
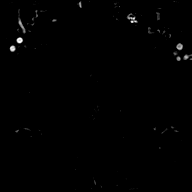

[Series 32: T1 · axial · 3.0mm · 1.15mm/px · 1 of 28 slices shown (22 of 48)]
[im 1/28]
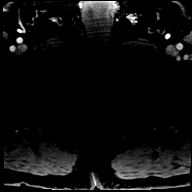

[Series 33: T1 · axial · 3.0mm · 1.15mm/px · 1 of 28 slices shown (23 of 48)]
[im 1/28]
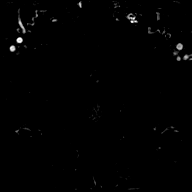

[Series 34: T1 · axial · 3.0mm · 1.15mm/px · 1 of 28 slices shown (24 of 48)]
[im 1/28]
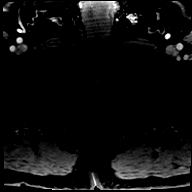

[Series 35: T1 · axial · 3.0mm · 1.15mm/px · 1 of 28 slices shown (25 of 48)]
[im 1/28]
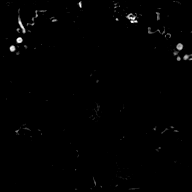

[Series 36: T1 · axial · 3.0mm · 1.15mm/px · 1 of 28 slices shown (26 of 48)]
[im 1/28]
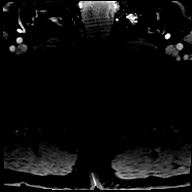

[Series 37: T1 · axial · 3.0mm · 1.15mm/px · 1 of 28 slices shown (27 of 48)]
[im 1/28]
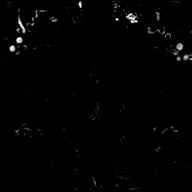

[Series 38: T1 · axial · 3.0mm · 1.15mm/px · 1 of 28 slices shown (28 of 48)]
[im 1/28]
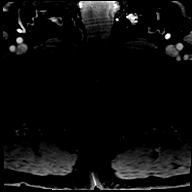

[Series 39: T1 · axial · 3.0mm · 1.15mm/px · 1 of 28 slices shown (29 of 48)]
[im 1/28]
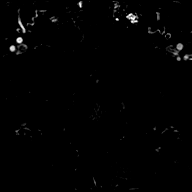

[Series 40: T1 · axial · 3.0mm · 1.15mm/px · 1 of 28 slices shown (30 of 48)]
[im 1/28]
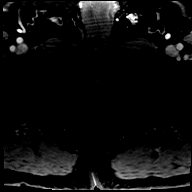

[Series 41: T1 · axial · 3.0mm · 1.15mm/px · 1 of 28 slices shown (31 of 48)]
[im 1/28]
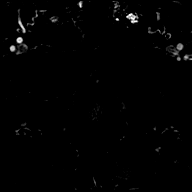

[Series 42: T1 · axial · 3.0mm · 1.15mm/px · 1 of 28 slices shown (32 of 48)]
[im 1/28]
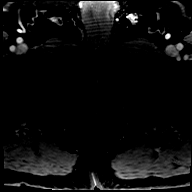

[Series 43: T1 · axial · 3.0mm · 1.15mm/px · 1 of 28 slices shown (33 of 48)]
[im 1/28]
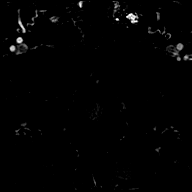

[Series 44: T1 · axial · 3.0mm · 1.15mm/px · 1 of 28 slices shown (34 of 48)]
[im 1/28]
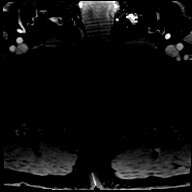

[Series 45: T1 · axial · 3.0mm · 1.15mm/px · 1 of 28 slices shown (35 of 48)]
[im 1/28]
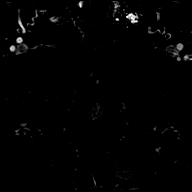

[Series 46: T1 · axial · 3.0mm · 1.15mm/px · 1 of 28 slices shown (36 of 48)]
[im 1/28]
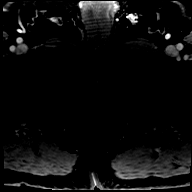

[Series 47: T1 · axial · 3.0mm · 1.15mm/px · 1 of 28 slices shown (37 of 48)]
[im 1/28]
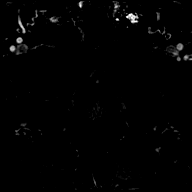

[Series 48: T1 · axial · 3.0mm · 1.15mm/px · 1 of 28 slices shown (38 of 48)]
[im 1/28]
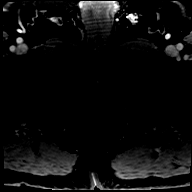

[Series 49: T1 · axial · 3.0mm · 1.15mm/px · 1 of 28 slices shown (39 of 48)]
[im 1/28]
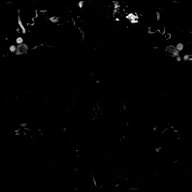

[Series 50: T1 · axial · 3.0mm · 1.15mm/px · 1 of 28 slices shown (40 of 48)]
[im 1/28]
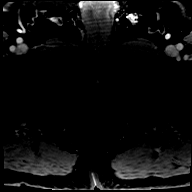

[Series 51: T1 · axial · 3.0mm · 1.15mm/px · 1 of 28 slices shown (41 of 48)]
[im 1/28]
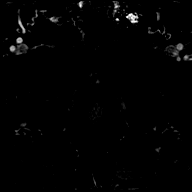

[Series 52: T1 · axial · 3.0mm · 1.15mm/px · 1 of 28 slices shown (42 of 48)]
[im 1/28]
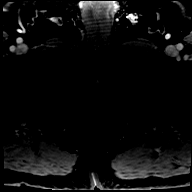

[Series 53: T1 · axial · 3.0mm · 1.15mm/px · 1 of 28 slices shown (43 of 48)]
[im 1/28]
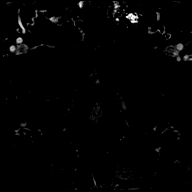

[Series 54: T1 · axial · 3.0mm · 1.15mm/px · 1 of 28 slices shown (44 of 48)]
[im 1/28]
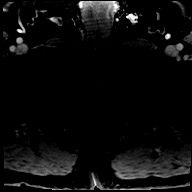

[Series 55: T1 · axial · 3.0mm · 1.15mm/px · 1 of 28 slices shown (45 of 48)]
[im 1/28]
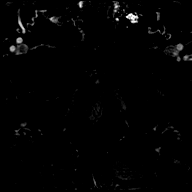

[Series 56: T1 · axial · 3.0mm · 1.15mm/px · 1 of 28 slices shown (46 of 48)]
[im 1/28]
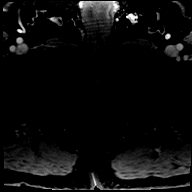

[Series 57: T1 · axial · 3.0mm · 1.15mm/px · 1 of 28 slices shown (47 of 48)]
[im 1/28]
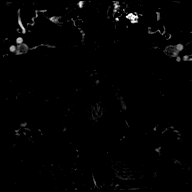

[Series 58: T1 · axial · 3.0mm · 1.15mm/px · 1 of 28 slices shown (48 of 48)]
[im 1/28]
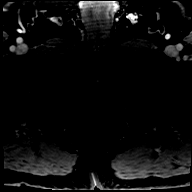

[56 of 56 positions shown; findings below may reference images not displayed]

FINDINGS: Prostate: Demonstrates moderate central gland enlargement and
heterogeneity, consistent with benign prostatic hyperplasia. No
dominant central gland nodule. No peripheral zone area of masslike
T2 hypointensity, restricted diffusion, or early post-contrast
enhancement.

Volume: 5.1 x 6.1 x 5.4 cm (volume = 88 cm^3)

Transcapsular spread:  Absent

Seminal vesicle involvement: Absent

Neurovascular bundle involvement: Absent

Pelvic adenopathy: Absent

Bone metastasis: Absent

Other findings: No significant free fluid.  Normal urinary bladder.
IMPRESSION: 1. No findings of macroscopic or high-grade prostate carcinoma.
2. Moderate benign prostatic hyperplasia.

## 2019-10-15 ENCOUNTER — Inpatient Hospital Stay: Payer: Medicare Other

## 2019-10-15 ENCOUNTER — Emergency Department: Payer: Medicare Other

## 2019-10-15 ENCOUNTER — Encounter: Payer: Self-pay | Admitting: Emergency Medicine

## 2019-10-15 ENCOUNTER — Other Ambulatory Visit: Payer: Self-pay

## 2019-10-15 ENCOUNTER — Encounter
Admission: EM | Disposition: A | Discharge status: Home / Self Care | Payer: Self-pay | Source: Home / Self Care | Attending: Internal Medicine

## 2019-10-15 ENCOUNTER — Inpatient Hospital Stay
Admission: EM | Admit: 2019-10-15 | Discharge: 2019-10-18 | DRG: 854 | Disposition: A | Discharge status: Home / Self Care | Payer: Medicare Other | Attending: Internal Medicine | Admitting: Internal Medicine

## 2019-10-15 DIAGNOSIS — Z96612 Presence of left artificial shoulder joint: Secondary | ICD-10-CM | POA: Diagnosis present

## 2019-10-15 DIAGNOSIS — B952 Enterococcus as the cause of diseases classified elsewhere: Secondary | ICD-10-CM | POA: Diagnosis present

## 2019-10-15 DIAGNOSIS — N400 Enlarged prostate without lower urinary tract symptoms: Secondary | ICD-10-CM | POA: Diagnosis present

## 2019-10-15 DIAGNOSIS — N281 Cyst of kidney, acquired: Secondary | ICD-10-CM | POA: Diagnosis present

## 2019-10-15 DIAGNOSIS — Z79899 Other long term (current) drug therapy: Secondary | ICD-10-CM | POA: Diagnosis not present

## 2019-10-15 DIAGNOSIS — N3289 Other specified disorders of bladder: Secondary | ICD-10-CM | POA: Diagnosis present

## 2019-10-15 DIAGNOSIS — Z7982 Long term (current) use of aspirin: Secondary | ICD-10-CM

## 2019-10-15 DIAGNOSIS — R509 Fever, unspecified: Secondary | ICD-10-CM

## 2019-10-15 DIAGNOSIS — K219 Gastro-esophageal reflux disease without esophagitis: Secondary | ICD-10-CM | POA: Diagnosis present

## 2019-10-15 DIAGNOSIS — N390 Urinary tract infection, site not specified: Secondary | ICD-10-CM | POA: Diagnosis not present

## 2019-10-15 DIAGNOSIS — N201 Calculus of ureter: Secondary | ICD-10-CM

## 2019-10-15 DIAGNOSIS — N202 Calculus of kidney with calculus of ureter: Secondary | ICD-10-CM | POA: Diagnosis present

## 2019-10-15 DIAGNOSIS — E785 Hyperlipidemia, unspecified: Secondary | ICD-10-CM | POA: Diagnosis present

## 2019-10-15 DIAGNOSIS — R972 Elevated prostate specific antigen [PSA]: Secondary | ICD-10-CM

## 2019-10-15 DIAGNOSIS — N136 Pyonephrosis: Secondary | ICD-10-CM | POA: Diagnosis present

## 2019-10-15 DIAGNOSIS — N401 Enlarged prostate with lower urinary tract symptoms: Secondary | ICD-10-CM

## 2019-10-15 DIAGNOSIS — Z20828 Contact with and (suspected) exposure to other viral communicable diseases: Secondary | ICD-10-CM | POA: Diagnosis present

## 2019-10-15 DIAGNOSIS — A419 Sepsis, unspecified organism: Principal | ICD-10-CM | POA: Diagnosis present

## 2019-10-15 DIAGNOSIS — R310 Gross hematuria: Secondary | ICD-10-CM | POA: Diagnosis present

## 2019-10-15 DIAGNOSIS — Z87442 Personal history of urinary calculi: Secondary | ICD-10-CM | POA: Diagnosis not present

## 2019-10-15 HISTORY — PX: CYSTOSCOPY WITH STENT PLACEMENT: SHX5790

## 2019-10-15 LAB — CBC WITH DIFFERENTIAL/PLATELET
Abs Immature Granulocytes: 0.05 10*3/uL (ref 0.00–0.07)
Basophils Absolute: 0.1 10*3/uL (ref 0.0–0.1)
Basophils Relative: 1 %
Eosinophils Absolute: 0.2 10*3/uL (ref 0.0–0.5)
Eosinophils Relative: 2 %
HCT: 48.5 % (ref 39.0–52.0)
Hemoglobin: 16.2 g/dL (ref 13.0–17.0)
Immature Granulocytes: 1 %
Lymphocytes Relative: 13 %
Lymphs Abs: 1.1 10*3/uL (ref 0.7–4.0)
MCH: 29.9 pg (ref 26.0–34.0)
MCHC: 33.4 g/dL (ref 30.0–36.0)
MCV: 89.6 fL (ref 80.0–100.0)
Monocytes Absolute: 0.7 10*3/uL (ref 0.1–1.0)
Monocytes Relative: 8 %
Neutro Abs: 6.7 10*3/uL (ref 1.7–7.7)
Neutrophils Relative %: 75 %
Platelets: 169 10*3/uL (ref 150–400)
RBC: 5.41 MIL/uL (ref 4.22–5.81)
RDW: 12.9 % (ref 11.5–15.5)
WBC: 8.7 10*3/uL (ref 4.0–10.5)
nRBC: 0 % (ref 0.0–0.2)

## 2019-10-15 LAB — COMPREHENSIVE METABOLIC PANEL
ALT: 25 U/L (ref 0–44)
AST: 26 U/L (ref 15–41)
Albumin: 4.3 g/dL (ref 3.5–5.0)
Alkaline Phosphatase: 88 U/L (ref 38–126)
Anion gap: 13 (ref 5–15)
BUN: 17 mg/dL (ref 8–23)
CO2: 25 mmol/L (ref 22–32)
Calcium: 9.2 mg/dL (ref 8.9–10.3)
Chloride: 101 mmol/L (ref 98–111)
Creatinine, Ser: 0.99 mg/dL (ref 0.61–1.24)
GFR calc Af Amer: >60 mL/min (ref >60)
GFR calc non Af Amer: >60 mL/min (ref >60)
Glucose, Bld: 119 mg/dL — ABNORMAL HIGH (ref 70–99)
Potassium: 3.9 mmol/L (ref 3.5–5.1)
Sodium: 139 mmol/L (ref 135–145)
Total Bilirubin: 3.6 mg/dL — ABNORMAL HIGH (ref 0.3–1.2)
Total Protein: 8 g/dL (ref 6.5–8.1)

## 2019-10-15 LAB — PROTIME-INR
INR: 1 (ref 0.8–1.2)
Prothrombin Time: 13.1 seconds (ref 11.4–15.2)

## 2019-10-15 LAB — URINE DRUG SCREEN, QUALITATIVE (ARMC ONLY)
Amphetamines, Ur Screen: NOT DETECTED
Barbiturates, Ur Screen: NOT DETECTED
Benzodiazepine, Ur Scrn: NOT DETECTED
Cannabinoid 50 Ng, Ur ~~LOC~~: NOT DETECTED
Cocaine Metabolite,Ur ~~LOC~~: NOT DETECTED
MDMA (Ecstasy)Ur Screen: NOT DETECTED
Methadone Scn, Ur: NOT DETECTED
Opiate, Ur Screen: NOT DETECTED
Phencyclidine (PCP) Ur S: NOT DETECTED
Tricyclic, Ur Screen: NOT DETECTED

## 2019-10-15 LAB — URINALYSIS, COMPLETE (UACMP) WITH MICROSCOPIC
Bacteria, UA: NONE SEEN
Bilirubin Urine: NEGATIVE
Glucose, UA: NEGATIVE mg/dL
Ketones, ur: NEGATIVE mg/dL
Nitrite: NEGATIVE
Protein, ur: 100 mg/dL — AB
RBC / HPF: >50 RBC/hpf — ABNORMAL HIGH (ref 0–5)
Specific Gravity, Urine: 1.019 (ref 1.005–1.030)
Squamous Epithelial / HPF: NONE SEEN (ref 0–5)
pH: 6 (ref 5.0–8.0)

## 2019-10-15 LAB — ETHANOL: Alcohol, Ethyl (B): <10 mg/dL (ref <10)

## 2019-10-15 LAB — SARS CORONAVIRUS 2 BY RT PCR (HOSPITAL ORDER, PERFORMED IN ~~LOC~~ HOSPITAL LAB): SARS Coronavirus 2: NEGATIVE

## 2019-10-15 LAB — APTT: aPTT: 34 seconds (ref 24–36)

## 2019-10-15 LAB — INFLUENZA PANEL BY PCR (TYPE A & B)
Influenza A By PCR: NEGATIVE
Influenza B By PCR: NEGATIVE

## 2019-10-15 LAB — LACTIC ACID, PLASMA: Lactic Acid, Venous: 1.3 mmol/L (ref 0.5–1.9)

## 2019-10-15 SURGERY — CYSTOSCOPY, WITH STENT INSERTION
Anesthesia: General | Laterality: Left

## 2019-10-15 MED ORDER — ONDANSETRON HCL 4 MG/2ML IJ SOLN
INTRAMUSCULAR | Status: AC
  Filled 2019-10-15: qty 2

## 2019-10-15 MED ORDER — ONDANSETRON HCL 4 MG PO TABS: 4.0000 mg | ORAL_TABLET | Freq: Four times a day (QID) | ORAL | Status: DC | PRN

## 2019-10-15 MED ORDER — OXYMETAZOLINE HCL 0.05 % NA SOLN
1.0000 | Freq: Two times a day (BID) | NASAL | Status: DC | PRN
  Filled 2019-10-15: qty 15

## 2019-10-15 MED ORDER — ACETAMINOPHEN 325 MG PO TABS
ORAL_TABLET | ORAL | Status: AC
  Filled 2019-10-15: qty 2

## 2019-10-15 MED ORDER — ACETAMINOPHEN 325 MG PO TABS: 650.0000 mg | ORAL_TABLET | Freq: Four times a day (QID) | ORAL | Status: DC | PRN

## 2019-10-15 MED ORDER — SODIUM CHLORIDE 0.9 % IV BOLUS
1000.0000 mL | Freq: Once | INTRAVENOUS | Status: AC
  Administered 2019-10-15: 1000 mL via INTRAVENOUS

## 2019-10-15 MED ORDER — ACETAMINOPHEN 650 MG RE SUPP: 650.0000 mg | Freq: Four times a day (QID) | RECTAL | Status: DC | PRN

## 2019-10-15 MED ORDER — SODIUM CHLORIDE 0.9 % IV SOLN
INTRAVENOUS | Status: AC
  Administered 2019-10-16: 03:00:00 via INTRAVENOUS

## 2019-10-15 MED ORDER — TAMSULOSIN HCL 0.4 MG PO CAPS
0.4000 mg | ORAL_CAPSULE | Freq: Every day | ORAL | Status: DC
  Administered 2019-10-16 – 2019-10-18 (×3): 0.4 mg via ORAL
  Filled 2019-10-15 (×3): qty 1

## 2019-10-15 MED ORDER — FENTANYL CITRATE (PF) 100 MCG/2ML IJ SOLN
INTRAMUSCULAR | Status: AC
  Filled 2019-10-15: qty 2

## 2019-10-15 MED ORDER — ATORVASTATIN CALCIUM 20 MG PO TABS
20.0000 mg | ORAL_TABLET | Freq: Every day | ORAL | Status: DC
  Administered 2019-10-16 – 2019-10-17 (×2): 20 mg via ORAL
  Filled 2019-10-15 (×2): qty 1

## 2019-10-15 MED ORDER — ACETAMINOPHEN 325 MG PO TABS
650.0000 mg | ORAL_TABLET | Freq: Once | ORAL | Status: AC | PRN
  Administered 2019-10-15: 18:00:00 650 mg via ORAL

## 2019-10-15 MED ORDER — GLYCOPYRROLATE 0.2 MG/ML IJ SOLN
INTRAMUSCULAR | Status: AC
  Filled 2019-10-15: qty 1

## 2019-10-15 MED ORDER — PROPOFOL 10 MG/ML IV BOLUS
INTRAVENOUS | Status: AC
  Filled 2019-10-15: qty 20

## 2019-10-15 MED ORDER — SUCCINYLCHOLINE CHLORIDE 20 MG/ML IJ SOLN
INTRAMUSCULAR | Status: AC
  Filled 2019-10-15: qty 1

## 2019-10-15 MED ORDER — ONDANSETRON HCL 4 MG/2ML IJ SOLN: 4.0000 mg | Freq: Four times a day (QID) | INTRAMUSCULAR | Status: DC | PRN

## 2019-10-15 MED ORDER — SODIUM CHLORIDE 0.9 % IV SOLN
1.0000 g | Freq: Once | INTRAVENOUS | Status: AC
  Administered 2019-10-15: 1 g via INTRAVENOUS

## 2019-10-15 MED ORDER — LEVOFLOXACIN IN D5W 750 MG/150ML IV SOLN
750.0000 mg | INTRAVENOUS | Status: DC
  Administered 2019-10-16 – 2019-10-17 (×2): 750 mg via INTRAVENOUS
  Filled 2019-10-15 (×3): qty 150

## 2019-10-15 SURGICAL SUPPLY — 25 items
BAG DRAIN CYSTO-URO LG1000N (MISCELLANEOUS) ×3 IMPLANT
BAG URO DRAIN 4000ML (MISCELLANEOUS) ×3 IMPLANT
BRUSH SCRUB EZ  4% CHG (MISCELLANEOUS)
BRUSH SCRUB EZ 4% CHG (MISCELLANEOUS) IMPLANT
CATH FOLEY 2WAY SIL 20X30 (CATHETERS) ×3 IMPLANT
CATH URETL 5X70 OPEN END (CATHETERS) ×3 IMPLANT
CONRAY 43 FOR UROLOGY 50M (MISCELLANEOUS) IMPLANT
GLIDEWIRE STR 0.035 150CM 3CM (WIRE) ×3 IMPLANT
GLOVE BIOGEL PI IND STRL 7.5 (GLOVE) ×3 IMPLANT
GLOVE BIOGEL PI INDICATOR 7.5 (GLOVE) ×6
GOWN STRL REUS W/ TWL LRG LVL3 (GOWN DISPOSABLE) ×2 IMPLANT
GOWN STRL REUS W/ TWL XL LVL3 (GOWN DISPOSABLE) IMPLANT
GOWN STRL REUS W/TWL LRG LVL3 (GOWN DISPOSABLE) ×4
GOWN STRL REUS W/TWL XL LVL3 (GOWN DISPOSABLE)
GUIDEWIRE STR DUAL SENSOR (WIRE) ×3 IMPLANT
KIT TURNOVER CYSTO (KITS) ×3 IMPLANT
PACK CYSTO AR (MISCELLANEOUS) ×3 IMPLANT
SET CYSTO W/LG BORE CLAMP LF (SET/KITS/TRAYS/PACK) ×3 IMPLANT
SOL .9 NS 3000ML IRR  AL (IV SOLUTION) ×2
SOL .9 NS 3000ML IRR UROMATIC (IV SOLUTION) ×1 IMPLANT
STENT URET 6FRX24 CONTOUR (STENTS) IMPLANT
STENT URET 6FRX26 CONTOUR (STENTS) ×3 IMPLANT
SURGILUBE 2OZ TUBE FLIPTOP (MISCELLANEOUS) ×3 IMPLANT
SYRINGE IRR TOOMEY STRL 70CC (SYRINGE) IMPLANT
WATER STERILE IRR 1000ML POUR (IV SOLUTION) ×3 IMPLANT

## 2019-10-15 NOTE — ED Notes (Signed)
Checked with Dr. Cinda Quest, Pt NOT a code stroke. Pt is a sepsis workup. See new orders.

## 2019-10-15 NOTE — Anesthesia Preprocedure Evaluation (Addendum)
Anesthesia Evaluation  Patient identified by MRN, date of birth, ID band Patient awake    Reviewed: Allergy & Precautions, H&P , NPO status , Patient's Chart, lab work & pertinent test results  Airway Mallampati: II  TM Distance: <3 FB Neck ROM: full    Dental  (+) Chipped   Pulmonary neg pulmonary ROS, neg COPD, neg recent URI,           Cardiovascular (-) angina(-) Past MI and (-) Cardiac Stents negative cardio ROS  (-) dysrhythmias      Neuro/Psych negative neurological ROS  negative psych ROS   GI/Hepatic Neg liver ROS, GERD  Controlled,  Endo/Other  negative endocrine ROS  Renal/GU Renal stones     Musculoskeletal   Abdominal   Peds  Hematology negative hematology ROS (+)   Anesthesia Other Findings Past Medical History: No date: Arthritis No date: GERD (gastroesophageal reflux disease) No date: Hyperlipemia  Past Surgical History: No date: RECONSTRUCTION OF EYELID 06/30/2019: REVERSE SHOULDER ARTHROPLASTY; Left     Comment:  Procedure: REVERSE SHOULDER ARTHROPLASTY;  Surgeon:               Corky Mull, MD;  Location: [facility] ORS;  Service:               Orthopedics;  Laterality: Left;  BMI    Body Mass Index: 33.91 kg/m      Reproductive/Obstetrics negative OB ROS                           Anesthesia Physical Anesthesia Plan  ASA: II  Anesthesia Plan: General ETT   Post-op Pain Management:    Induction:   PONV Risk Score and Plan: Ondansetron, Dexamethasone and Treatment may vary due to age or medical condition  Airway Management Planned:   Additional Equipment:   Intra-op Plan:   Post-operative Plan:   Informed Consent: I have reviewed the patients History and Physical, chart, labs and discussed the procedure including the risks, benefits and alternatives for the proposed anesthesia with the patient or authorized representative who has indicated his/her  understanding and acceptance.     Dental Advisory Given  Plan Discussed with: Anesthesiologist, CRNA and Surgeon  Anesthesia Plan Comments:        Anesthesia Quick Evaluation

## 2019-10-15 NOTE — ED Triage Notes (Signed)
Pt here for fever. Denies cough, fever, shob/cp, abdominal pain, NVD, or urinary sx.  No exposure to covid that he knows of.  Unlabored. Appears well. Ambulatory. VSS

## 2019-10-15 NOTE — H&P (Signed)
History and Physical    Ryan GurneyCarl R [place]gan WUJ:[id number] DOB: [birth year] DOA: 10/15/2019  PCP: Marguarite ArbourSparks, Jeffrey D, MD  Patient coming from: Home  I have personally briefly reviewed patient[facility]  Chief Complaint: Fever  HPI: Ryan GurneyCarl R Croak is a 73 y.o. male with medical history significant for hyperlipidemia, GERD, BPH, history of nephrolithiasis who presents to the ED for evaluation of fevers.  Patient states he was in his usual state of health until earlier this afternoon (10/15/2019) when he developed new onset of fevers, chills, and some diaphoresis.  He noticed having increased urinary frequency with some burning on urination recently as well.  He has not had any abdominal or flank pain.  He denies any chest pain, palpitations, dyspnea, cough, or diarrhea.  He reports a prior history of kidney stones which were painful in the past.  He follows with urology, Dr. Lonna CobbStoioff, as an outpatient.  He has a history of BPH for which he takes tamsulosin daily.  ED Course:  Initial vitals showed BP 147/88, pulse 120, RR 18, temp 102.6 Fahrenheit, SPO2 96% on room air.  Labs notable for WBC 8.7, hemoglobin 16.2, platelets 169,000, BUN 17, creatinine 0.99, bicarb 25, sodium 139, potassium 3.9, serum glucose 119, serum ethanol undetectable, lactic acid 1.3.  Urinalysis showed negative nitrites, small leukocytes, 21-WBCs no bacteria on microscopy.  Blood and urine cultures were obtained and pending.  Influenza panel was negative.  SARS-CoV-2 test was obtained and pending.  Portable chest x-ray was negative for acute focal consolidation, effusion, or edema.  CT renal stone study showed a distal 4 mm left ureteral stone with minimal ureteral fullness, bilateral nonobstructing renal calculi, increased right renal cyst size from prior, diverticulosis without evidence of diverticulitis.  Patient was given IV ceftriaxone and 1 L normal saline.  EDP discussed the case with  on-call urology who will consult on patient for possible cystoscopy with stent placement tonight.  The hospitalist service was consulted to admit for further evaluation management.  Review of Systems: All systems reviewed and are negative except as documented in history of present illness above.   Past Medical History:  Diagnosis Date  . Arthritis   . GERD (gastroesophageal reflux disease)   . Hyperlipemia     Past Surgical History:  Procedure Laterality Date  . RECONSTRUCTION OF EYELID    . REVERSE SHOULDER ARTHROPLASTY Left 06/30/2019   Procedure: REVERSE SHOULDER ARTHROPLASTY;  Surgeon: Christena FlakePoggi, John J, MD;  Location: [facility] ORS;  Service: Orthopedics;  Laterality: Left;    Social History:  reports that he has never smoked. He has never used smokeless tobacco. He reports that he does not drink alcohol or use drugs.  No Known Allergies  Family History  Problem Relation Age of Onset  . Prostate cancer Neg Hx   . Bladder Cancer Neg Hx   . Kidney cancer Neg Hx      Prior to Admission medications   Medication Sig Start Date End Date Taking? Authorizing Provider  aspirin EC 81 MG tablet Take 81 mg by mouth daily.   Yes [provider]  atorvastatin (LIPITOR) 20 MG tablet Take 20 mg by mouth at bedtime.  10/07/18  Yes [provider]  esomeprazole (NEXIUM) 20 MG capsule Take 20 mg by mouth daily.    Yes [provider]  loratadine (CLARITIN) 10 MG tablet Take 10 mg by mouth at bedtime.   Yes [provider]  tamsulosin (FLOMAX) 0.4 MG CAPS capsule  Take 1 capsule (0.4 mg total) by mouth daily. 01/02/19 01/02/20 Yes Stoioff, Verna Czech, MD  acetaminophen (TYLENOL) 650 MG CR tablet Take 1,300 mg by mouth daily.    [provider]  Carboxymethylcellul-Glycerin (LUBRICATING EYE DROPS OP) Place 1 drop into both eyes daily as needed (dry eyes).    [provider]  oxymetazoline (AFRIN) 0.05 % nasal spray Place 1 spray into both nostrils 2 (two)  times daily as needed for congestion.    [provider]  sildenafil (VIAGRA) 100 MG tablet Take 100 mg by mouth daily as needed for erectile dysfunction.    [provider]  traMADol (ULTRAM) 50 MG tablet Take 1 tablet (50 mg total) by mouth every 6 (six) hours as needed for moderate pain. Patient not taking: Reported on 10/15/2019 07/01/19   Anson [place], PA-C    Physical Exam: Vitals:   10/15/19 1810 10/15/19 1956 10/15/19 2047 10/15/19 2110  BP: (!) 147/88   137/85  Pulse: (!) 120   93  Resp: 18   18  Temp: (!) 102.6 F (39.2 C) 99.2 F (37.3 C)  98.3 F (36.8 C)  TempSrc: Oral Oral  Oral  SpO2: 96%   95%  Weight: 90.7 kg  98.2 kg   Height: 5\' 7"  (1.702 m)       Constitutional: Resting supine in bed, NAD, calm, comfortable Eyes: PERRL, lids and conjunctivae normal ENMT: Mucous membranes are moist. Posterior pharynx clear of any exudate or lesions.Normal dentition.  Neck: normal, supple, no masses. Respiratory: clear to auscultation bilaterally, no wheezing, no crackles. Normal respiratory effort. No accessory muscle use.  Cardiovascular: Regular rate and rhythm, no murmurs / rubs / gallops. No extremity edema. 2+ pedal pulses. Abdomen: no tenderness, no masses palpated. No hepatosplenomegaly. Bowel sounds positive.  Musculoskeletal: no clubbing / cyanosis. No joint deformity upper and lower extremities. Good ROM, no contractures. Normal muscle tone.  Skin: no rashes, lesions, ulcers. No induration Neurologic: CN 2-12 grossly intact. Sensation intact, Strength 5/5 in all 4.  Psychiatric: Normal judgment and insight. Alert and oriented x 3. Normal mood.     Labs on Admission: I have personally reviewed following labs and imaging studies  CBC: Recent Labs  Lab 10/15/19 1825  WBC 8.7  NEUTROABS 6.7  HGB 16.2  HCT 48.5  MCV 89.6  PLT 169   Basic Metabolic Panel: Recent Labs  Lab 10/15/19 1825  NA 139  K 3.9  CL 101  CO2 25  GLUCOSE 119*   BUN 17  CREATININE 0.99  CALCIUM 9.2   GFR: Estimated Creatinine Clearance: 73.1 mL/min (by C-G formula based on SCr of 0.99 mg/dL). Liver Function Tests: Recent Labs  Lab 10/15/19 1825  AST 26  ALT 25  ALKPHOS 88  BILITOT 3.6*  PROT 8.0  ALBUMIN 4.3   No results for input(s): LIPASE, AMYLASE in the last 168 hours. No results for input(s): AMMONIA in the last 168 hours. Coagulation Profile: Recent Labs  Lab 10/15/19 1933  INR 1.0   Cardiac Enzymes: No results for input(s): CKTOTAL, CKMB, CKMBINDEX, TROPONINI in the last 168 hours. BNP (last 3 results) No results for input(s): PROBNP in the last 8760 hours. HbA1C: No results for input(s): HGBA1C in the last 72 hours. CBG: No results for input(s): GLUCAP in the last 168 hours. Lipid Profile: No results for input(s): CHOL, HDL, LDLCALC, TRIG, CHOLHDL, LDLDIRECT in the last 72 hours. Thyroid Function Tests: No results for input(s): TSH, T4TOTAL, FREET4, T3FREE, THYROIDAB in  the last 72 hours. Anemia Panel: No results for input(s): VITAMINB12, FOLATE, FERRITIN, TIBC, IRON, RETICCTPCT in the last 72 hours. Urine analysis:    Component Value Date/Time   COLORURINE YELLOW (A) 10/15/2019 1829   APPEARANCEUR HAZY (A) 10/15/2019 1829   APPEARANCEUR Clear 04/07/2019 1033   LABSPEC 1.019 10/15/2019 1829   LABSPEC 1.018 03/12/2015 1335   PHURINE 6.0 10/15/2019 1829   GLUCOSEU NEGATIVE 10/15/2019 1829   GLUCOSEU Negative 03/12/2015 1335   HGBUR MODERATE (A) 10/15/2019 1829   BILIRUBINUR NEGATIVE 10/15/2019 1829   BILIRUBINUR Negative 04/07/2019 [place] 10/15/2019 1829   PROTEINUR 100 (A) 10/15/2019 1829   NITRITE NEGATIVE 10/15/2019 1829   LEUKOCYTESUR SMALL (A) 10/15/2019 1829   LEUKOCYTESUR Negative 03/12/2015 1335    Radiological Exams on Admission: Dg Chest Port 1 View  Result Date: 10/15/2019 CLINICAL DATA:  Fever. EXAM: PORTABLE CHEST 1 VIEW COMPARISON:  None. FINDINGS: The heart is normal in  size. Mild aortic tortuosity. Pulmonary vasculature is normal. No consolidation, pleural effusion, or pneumothorax. No acute osseous abnormalities are seen. Left shoulder arthroplasty. IMPRESSION: No acute chest findings. Electronically Signed   By: Keith Rake M.D.   On: 10/15/2019 19:05   Ct Renal Stone Study  Result Date: 10/15/2019 CLINICAL DATA:  Hematuria and fevers with bilateral flank pain EXAM: CT ABDOMEN AND PELVIS WITHOUT CONTRAST TECHNIQUE: Multidetector CT imaging of the abdomen and pelvis was performed following the standard protocol without IV contrast. COMPARISON:  03/12/2015 FINDINGS: Lower chest: No acute abnormality. Hepatobiliary: Diffuse fatty infiltration of the liver is noted. The gallbladder is within normal limits. Pancreas: Unremarkable. No pancreatic ductal dilatation or surrounding inflammatory changes. Spleen: Normal in size without focal abnormality. Adrenals/Urinary Tract: Adrenal glands are unremarkable. The kidneys are well visualized bilaterally. Scattered small nonobstructing renal calculi are noted bilaterally left greater than right. Hypodense lesion is noted arising from the right kidney measuring approximately 3.3 cm. This has increased in size when compared with the prior exam. No obstructive changes are noted on right. The collecting system and left ureter show evidence of a 4 mm distal ureteral stone best seen on image number 76 of series 2 and image number 95 of series 5. This likely contributes to the patient's underlying discomfort. The bladder is partially distended. Stomach/Bowel: Diffuse diverticular change of the colon is seen. No findings to suggest diverticulitis are noted. The appendix is well visualized and within normal limits. No obstructive or inflammatory changes of the small bowel are seen. Small sliding-type hiatal hernia is noted. Vascular/Lymphatic: Aortic atherosclerosis. No enlarged abdominal or pelvic lymph nodes. Reproductive: Prostate is  mildly prominent indenting upon the inferior aspect of the bladder. Other: No abdominal wall hernia or abnormality. No abdominopelvic ascites. Musculoskeletal: Degenerative changes of lumbar spine are seen. No compression deformity is noted. IMPRESSION: Distal 4 mm left ureteral stone with minimal ureteral fullness. Bilateral nonobstructing renal calculi. Right renal cyst which has increased in size from the prior exam. Diverticulosis without diverticulitis. Chronic changes as described. Electronically Signed   By: Inez Catalina M.D.   On: 10/15/2019 20:23    EKG: Independently reviewed. Sinus tachycardia, rate 114, right increase when compared to prior.  Assessment/Plan Principal Problem:   Sepsis secondary to UTI [facility]) Active Problems:   BPH (benign prostatic hyperplasia)   Left ureteral calculus   Hyperlipidemia  CHARELS STAMBAUGH is a 73 y.o. male with medical history significant for hyperlipidemia, GERD, BPH, history of nephrolithiasis who is admitted with sepsis due to UTI/left  ureteral calculus.  Sepsis secondary to UTI associated with left ureteral calculus: Patient with fever, tachycardia, and CT evidence of distal left ureteral calculus.  Urology has been consulted and plan for ureteral stent placement tonight.  Prior urine culture July 2020 grew out Enterococcus faecalis. -Will start IV Levaquin 750 mg daily pending culture data -Follow-up urine and blood cultures -Continue IV fluid resuscitation overnight -Appreciate urology assistance, plan for cystoscopy with stent placement tonight  Hyperlipidemia: Resume home atorvastatin tomorrow.  BPH: Resume tamsulosin tomorrow.  DVT prophylaxis: SCDs Code Status: Full code, confirmed with patient Family Communication: Discussed with patient, he is updated his brother Disposition Plan: Pending clinical progress Consults called: Urology Admission status: Inpatient for management of sepsis due to UTI and left ureteral calculus requiring  urological intervention, IV antibiotic therapy, and further cultural data.  Darreld Mclean MD [facility]  If 7PM-7AM, please contact night-coverage [email or web address]  10/15/2019, 10:02 PM

## 2019-10-15 NOTE — ED Provider Notes (Signed)
[facility] Emergency Department Provider Note   ____________________________________________   First MD Initiated Contact with Patient 10/15/19 1848     (approximate)  I have reviewed the triage vital signs and the nursing notes.   HISTORY  Chief Complaint Fever    HPI Ryan Benjamin is a 73 y.o. male patient reports onset of a fever today.  He is not having any coughing or abdominal pain or nausea or vomiting or sore throat or dysuria or back pain or belly pain or anything else.  He is just having a fever.  Fevers up to 102.6 now.  Heart rate is 120.         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 Authorizing Provider  acetaminophen (TYLENOL) 650 MG CR tablet Take 1,300 mg by mouth daily.    [provider]  aspirin EC 325 MG tablet Take 1 tablet (325 mg total) by mouth daily. 07/01/19   Lattie Corns, PA-C  atorvastatin (LIPITOR) 20 MG tablet Take 20 mg by mouth at bedtime.  10/07/18   [provider]  Carboxymethylcellul-Glycerin (LUBRICATING EYE DROPS OP) Place 1 drop into both eyes daily as needed (dry eyes).    [provider]  esomeprazole (NEXIUM) 20 MG capsule Take 20 mg by mouth daily.     [provider]  loratadine (CLARITIN) 10 MG tablet Take 10 mg by mouth at bedtime.    [provider]  oxymetazoline (AFRIN) 0.05 % nasal spray Place 1 spray into both nostrils 2 (two) times daily as needed for congestion.    [provider]  sildenafil (VIAGRA) 100 MG tablet Take 100 mg by mouth daily as needed for erectile dysfunction.    [provider]  tamsulosin (FLOMAX) 0.4 MG CAPS capsule Take 1 capsule (0.4 mg total) by mouth daily. 01/02/19 01/02/20  Stoioff, Ronda Fairly, MD  traMADol (ULTRAM) 50 MG tablet Take 1 tablet (50 mg total) by mouth every 6 (six) hours as needed for moderate pain. 07/01/19   Lattie Corns, PA-C    Allergies Patient has no known allergies.  Family History  Problem Relation Age of Onset   Prostate cancer Neg Hx    Bladder Cancer Neg Hx    Kidney cancer Neg Hx     Social History Social History   Tobacco Use   Smoking status: Never Smoker   Smokeless tobacco: Never Used  Substance Use Topics   Alcohol use: Never    Frequency: Never   Drug use: Never    Review of Systems  Constitutional: fever/chills Eyes: No visual changes. ENT: No sore throat. Cardiovascular: Denies chest pain. Respiratory: Denies shortness of breath. Gastrointestinal: No abdominal pain.  No nausea, no vomiting.  No diarrhea.  No constipation. Genitourinary:  Negative for dysuria. Musculoskeletal: Negative for back pain. Skin: Negative for rash. Neurological: Negative for headaches, focal weakness ____________________________________________   PHYSICAL EXAM:  VITAL SIGNS: ED Triage Vitals [10/15/19 1810]  Enc Vitals Group     BP (!) 147/88     Pulse Rate (!) 120     Resp 18     Temp (!) 102.6 F (39.2 C)     Temp Source Oral     SpO2 96 %     Weight 200 lb (90.7 kg)     Height 5\' 7"  (1.702 m)     Head Circumference      Peak Flow      Pain Score 0     Pain Loc      Pain Edu?    Constitutional: Alert and oriented. Well  appearing and in no acute distress. Eyes: Conjunctivae are normal. PER. EOMI. Head: Atraumatic. Nose: No congestion/rhinnorhea. Mouth/Throat: Mucous membranes are moist.  Oropharynx non-erythematous. Neck: No stridor.   Cardiovascular: Normal rate, regular rhythm. Grossly normal heart sounds.  Good peripheral circulation. Respiratory: Normal respiratory effort.  No retractions. Lungs CTAB. Gastrointestinal: Soft and nontender. No distention. No abdominal bruits. No CVA tenderness. Musculoskeletal: No lower extremity tenderness nor edema.   Neurologic:  Normal speech and language. No gross focal neurologic deficits are appreciated.  Skin:  Skin is warm, dry and intact. No rash noted.   ____________________________________________   LABS (all labs ordered are listed, but only abnormal results are displayed)  Labs Reviewed  COMPREHENSIVE METABOLIC PANEL - Abnormal; Notable for the following components:      Result Value   Glucose, Bld 119 (*)    Total Bilirubin 3.6 (*)    All other components within normal limits  URINALYSIS, COMPLETE (UACMP) WITH MICROSCOPIC - Abnormal; Notable for the following components:   Color, Urine YELLOW (*)    APPearance HAZY (*)    Hgb urine dipstick MODERATE (*)    Protein, ur 100 (*)    Leukocytes,Ua SMALL (*)    RBC / HPF >50 (*)    All other components within normal limits  SARS CORONAVIRUS 2 (TAT 6-24 HRS)  CULTURE, BLOOD (ROUTINE X 2)  CULTURE, BLOOD (ROUTINE X 2)  URINE CULTURE  CBC WITH DIFFERENTIAL/PLATELET  ETHANOL  PROTIME-INR  APTT  LACTIC ACID, PLASMA  INFLUENZA PANEL BY PCR (TYPE A & B)  URINE DRUG SCREEN, QUALITATIVE ([facility] ONLY)  LACTIC ACID, PLASMA   ____________________________________________  EKG   ____________________________________________  RADIOLOGY  ED MD interpretation: Chest x-ray reviewed by me I do not see any obvious infiltrates we will wait for the radiologist reading  Official radiology report(s): Dg  Chest Port 1 View  Result Date: 10/15/2019 CLINICAL DATA:  Fever. EXAM: PORTABLE CHEST 1 VIEW COMPARISON:  None. FINDINGS: The heart is normal in size. Mild aortic tortuosity. Pulmonary vasculature is normal. No consolidation, pleural effusion, or pneumothorax. No acute osseous abnormalities are seen. Left shoulder arthroplasty. IMPRESSION: No acute chest findings. Electronically Signed   By: Narda RutherfordMelanie  Sanford M.D.   On: 10/15/2019 19:05   Ct Renal Stone Study  Result Date: 10/15/2019 CLINICAL DATA:  Hematuria and fevers with bilateral flank pain EXAM: CT ABDOMEN AND PELVIS WITHOUT CONTRAST TECHNIQUE: Multidetector CT imaging of the abdomen and pelvis was performed following the standard protocol without IV contrast. COMPARISON:  03/12/2015 FINDINGS: Lower chest: No acute abnormality. Hepatobiliary: Diffuse fatty infiltration of the liver is noted. The gallbladder is within normal limits. Pancreas: Unremarkable. No pancreatic ductal dilatation  or surrounding inflammatory changes. Spleen: Normal in size without focal abnormality. Adrenals/Urinary Tract: Adrenal glands are unremarkable. The kidneys are well visualized bilaterally. Scattered small nonobstructing renal calculi are noted bilaterally left greater than right. Hypodense lesion is noted arising from the right kidney measuring approximately 3.3 cm. This has increased in size when compared with the prior exam. No obstructive changes are noted on right. The collecting system and left ureter show evidence of a 4 mm distal ureteral stone best seen on image number 76 of series 2 and image number 95 of series 5. This likely contributes to the patient's underlying discomfort. The bladder is partially distended. Stomach/Bowel: Diffuse diverticular change of the colon is seen. No findings to suggest diverticulitis are noted. The appendix is well visualized and within normal limits. No obstructive or inflammatory changes of the small bowel are seen. Small  sliding-type hiatal hernia is noted. Vascular/Lymphatic: Aortic atherosclerosis. No enlarged abdominal or pelvic lymph nodes. Reproductive: Prostate is mildly prominent indenting upon the inferior aspect of the bladder. Other: No abdominal wall hernia or abnormality. No abdominopelvic ascites. Musculoskeletal: Degenerative changes of lumbar spine are seen. No compression deformity is noted. IMPRESSION: Distal 4 mm left ureteral stone with minimal ureteral fullness. Bilateral nonobstructing renal calculi. Right renal cyst which has increased in size from the prior exam. Diverticulosis without diverticulitis. Chronic changes as described. Electronically Signed   By: Alcide Clever M.D.   On: 10/15/2019 20:23    ____________________________________________   PROCEDURES  Procedure(s) performed (including Critical Care): Critical care time 35 minutes this includes speaking with the patient and examining him very carefully working his CT scan reports and results and discussing him with the hospitalist as well as texting with the urologist. Procedures   ____________________________________________   INITIAL IMPRESSION / ASSESSMENT AND PLAN / ED COURSE  Patient with meet sepsis criteria.  His only source appears to be his urine.  CT shows what appears to be a partially obstructing stone.  And waiting currently for the urologist to get back with me to see if you want to stent this gentleman as well.           ____________________________________________   FINAL CLINICAL IMPRESSION(S) / ED DIAGNOSES  Final diagnoses:  Sepsis, due to unspecified organism, unspecified whether acute organ dysfunction present [facility])  Urinary tract infection with hematuria, site unspecified  Ureteral stone     ED Discharge Orders    None       Note:  This document was prepared using Dragon voice recognition software and may include unintentional dictation errors.    Arnaldo Natal, MD 10/15/19 2050

## 2019-10-15 NOTE — Consult Note (Addendum)
   10/15/19 10:14 PM   Twanna Hy [birth year] [id number]  CC: Fevers  HPI: I saw Mr. Emert in consultation from Dr. Rip Harbour for fevers over 103 degrees.  He is a 73 year old healthy male who is regularly followed by Dr. Bernardo Heater as an outpatient for history of elevated PSAs and chronic asymptomatic bacteriuria.  He started having chills this afternoon and felt warm and checked his temperature and it was 102.5 and he presented to the ED. On presentation he was tachycardic to 120 and febrile to 103.  He denies any other symptoms including nausea/vomiting, abdominal pain, flank pain, dysuria, urgency, or frequency.  CT scan was performed and showed a left 5 mm distal ureteral stone with some mild upstream hydroureteronephrosis, and urinalysis showed greater than 50 RBCs, 20-50 WBCs, no bacteria.  He has been n.p.o. since around noon today.  PMH: Past Medical History:  Diagnosis Date  . Arthritis   . GERD (gastroesophageal reflux disease)   . Hyperlipemia     Surgical History: Past Surgical History:  Procedure Laterality Date  . RECONSTRUCTION OF EYELID    . REVERSE SHOULDER ARTHROPLASTY Left 06/30/2019   Procedure: REVERSE SHOULDER ARTHROPLASTY;  Surgeon: Corky Mull, MD;  Location: [facility] ORS;  Service: Orthopedics;  Laterality: Left;    Allergies: No Known Allergies  Family History: Family History  Problem Relation Age of Onset  . Prostate cancer Neg Hx   . Bladder Cancer Neg Hx   . Kidney cancer Neg Hx     Social History:  reports that he has never smoked. He has never used smokeless tobacco. He reports that he does not drink alcohol or use drugs.  ROS: Please see flowsheet from today's date for complete review of systems.  Physical Exam: BP 137/85 (BP Location: Right Arm)   Pulse 93   Temp 98.3 F (36.8 C) (Oral)   Resp 18   Ht 5\' 7"  (1.702 m)   Wt 98.2 kg   SpO2 95%   BMI 33.91 kg/m    Constitutional:  Alert and oriented, No acute distress.  Well-appearing  Cardiovascular: Regular rate and rhythm Respiratory: Clear to auscultation bilaterally GI: Abdomen is soft, nontender, nondistended, no abdominal masses GU: No CVA tenderness Lymph: No cervical or inguinal lymphadenopathy. Skin: No rashes, bruises or suspicious lesions. Neurologic: Grossly intact, no focal deficits, moving all 4 extremities. Psychiatric: Normal mood and affect.  Laboratory Data: Reviewed  Enterococcus UTI in July 2020  Pertinent Imaging: I have personally reviewed the CT notable for 5 mm left distal ureteral stone with mild hydroureteronephrosis  Assessment & Plan:   In summary, he is a healthy 73 year old male who presents with fever of 103 and chills and was found to have a 5 mm left distal ureteral stone on CT with relatively benign urinalysis.  In the setting of his fevers to 103, highly suspicious for upstream infection behind an obstructing stone.  We discussed the need for urgent ureteral stent placement for drainage and source control, admission for antibiotics, and delayed ureteroscopy in 2 to 3 weeks for definitive management of his stone.  We discussed the risks and benefits of stent placement at length including bleeding, infection, stent related symptoms, sepsis, ureteral injury, possible need for nephrostomy tube.  Cystoscopy, left ureteral stent placement tonight Admit to hospitalist Ceftriaxone and Levaquin based on prior E. coli and Enterococcus cultures  Billey Co, MD  [facility] [address] [place], [place] [address] [phone]

## 2019-10-15 NOTE — Progress Notes (Signed)
CODE SEPSIS - PHARMACY COMMUNICATION  **Broad Spectrum Antibiotics should be administered within 1 hour of Sepsis diagnosis**  Time Code Sepsis Called/Page Received:  11/12 @ 1917  Antibiotics Ordered:  Ceftriaxone , levofloxacin   Time of 1st antibiotic administration:  Ceftriaxone 1 gm IV X 1 given on 11/12 @ 2048  Additional action taken by pharmacy:   If necessary, Name of Provider/Nurse Contacted:     Telly Broberg D ,PharmD Clinical Pharmacist  10/15/2019  10:16 PM

## 2019-10-16 ENCOUNTER — Other Ambulatory Visit: Payer: Self-pay

## 2019-10-16 ENCOUNTER — Encounter: Payer: Self-pay | Admitting: Urology

## 2019-10-16 ENCOUNTER — Inpatient Hospital Stay: Payer: Medicare Other | Admitting: Anesthesiology

## 2019-10-16 DIAGNOSIS — A419 Sepsis, unspecified organism: Principal | ICD-10-CM

## 2019-10-16 DIAGNOSIS — N390 Urinary tract infection, site not specified: Secondary | ICD-10-CM

## 2019-10-16 LAB — BASIC METABOLIC PANEL
Anion gap: 10 (ref 5–15)
BUN: 16 mg/dL (ref 8–23)
CO2: 23 mmol/L (ref 22–32)
Calcium: 8.2 mg/dL — ABNORMAL LOW (ref 8.9–10.3)
Chloride: 103 mmol/L (ref 98–111)
Creatinine, Ser: 0.67 mg/dL (ref 0.61–1.24)
GFR calc Af Amer: >60 mL/min (ref >60)
GFR calc non Af Amer: >60 mL/min (ref >60)
Glucose, Bld: 123 mg/dL — ABNORMAL HIGH (ref 70–99)
Potassium: 4.1 mmol/L (ref 3.5–5.1)
Sodium: 136 mmol/L (ref 135–145)

## 2019-10-16 LAB — CBC
HCT: 44.8 % (ref 39.0–52.0)
Hemoglobin: 14.4 g/dL (ref 13.0–17.0)
MCH: 29.6 pg (ref 26.0–34.0)
MCHC: 32.1 g/dL (ref 30.0–36.0)
MCV: 92.2 fL (ref 80.0–100.0)
Platelets: 161 10*3/uL (ref 150–400)
RBC: 4.86 MIL/uL (ref 4.22–5.81)
RDW: 12.7 % (ref 11.5–15.5)
WBC: 8.5 10*3/uL (ref 4.0–10.5)
nRBC: 0 % (ref 0.0–0.2)

## 2019-10-16 MED ORDER — CHLORHEXIDINE GLUCONATE CLOTH 2 % EX PADS
6.0000 | MEDICATED_PAD | Freq: Every day | CUTANEOUS | Status: DC
  Administered 2019-10-16 – 2019-10-18 (×3): 6 via TOPICAL

## 2019-10-16 MED ORDER — IOHEXOL 180 MG/ML  SOLN
INTRAMUSCULAR | Status: DC | PRN
  Administered 2019-10-16: 10 mL

## 2019-10-16 MED ORDER — GLYCOPYRROLATE 0.2 MG/ML IJ SOLN
INTRAMUSCULAR | Status: DC | PRN
  Administered 2019-10-16: 0.2 mg via INTRAVENOUS

## 2019-10-16 MED ORDER — PROPOFOL 10 MG/ML IV BOLUS
INTRAVENOUS | Status: DC | PRN
  Administered 2019-10-16: 150 mg via INTRAVENOUS

## 2019-10-16 MED ORDER — FENTANYL CITRATE (PF) 100 MCG/2ML IJ SOLN
INTRAMUSCULAR | Status: DC | PRN
  Administered 2019-10-16: 50 ug via INTRAVENOUS
  Administered 2019-10-16 (×2): 25 ug via INTRAVENOUS

## 2019-10-16 MED ORDER — PANTOPRAZOLE SODIUM 40 MG PO TBEC
40.0000 mg | DELAYED_RELEASE_TABLET | Freq: Every day | ORAL | Status: DC
  Administered 2019-10-16 – 2019-10-18 (×3): 40 mg via ORAL
  Filled 2019-10-16 (×3): qty 1

## 2019-10-16 MED ORDER — EPHEDRINE SULFATE 50 MG/ML IJ SOLN
INTRAMUSCULAR | Status: DC | PRN
  Administered 2019-10-16: 10 mg via INTRAVENOUS

## 2019-10-16 MED ORDER — LACTATED RINGERS IV SOLN
INTRAVENOUS | Status: AC
  Administered 2019-10-16: 17:00:00 via INTRAVENOUS

## 2019-10-16 MED ORDER — LORATADINE 10 MG PO TABS
10.0000 mg | ORAL_TABLET | Freq: Every day | ORAL | Status: DC
  Administered 2019-10-16 – 2019-10-17 (×2): 10 mg via ORAL
  Filled 2019-10-16 (×2): qty 1

## 2019-10-16 MED ORDER — LACTATED RINGERS IV SOLN
INTRAVENOUS | Status: DC | PRN
  Administered 2019-10-16: 01:00:00 via INTRAVENOUS

## 2019-10-16 MED ORDER — TRAMADOL HCL 50 MG PO TABS: 50.0000 mg | ORAL_TABLET | Freq: Two times a day (BID) | ORAL | Status: DC | PRN

## 2019-10-16 MED ORDER — OXYCODONE HCL 5 MG PO TABS: 5.0000 mg | ORAL_TABLET | Freq: Once | ORAL | Status: DC | PRN

## 2019-10-16 MED ORDER — SUCCINYLCHOLINE CHLORIDE 20 MG/ML IJ SOLN
INTRAMUSCULAR | Status: DC | PRN
  Administered 2019-10-16: 100 mg via INTRAVENOUS

## 2019-10-16 MED ORDER — ASPIRIN EC 81 MG PO TBEC
81.0000 mg | DELAYED_RELEASE_TABLET | Freq: Every day | ORAL | Status: DC
  Administered 2019-10-16 – 2019-10-18 (×3): 81 mg via ORAL
  Filled 2019-10-16 (×3): qty 1

## 2019-10-16 MED ORDER — FENTANYL CITRATE (PF) 100 MCG/2ML IJ SOLN: 25.0000 ug | INTRAMUSCULAR | Status: DC | PRN

## 2019-10-16 MED ORDER — ONDANSETRON HCL 4 MG/2ML IJ SOLN
INTRAMUSCULAR | Status: DC | PRN
  Administered 2019-10-16: 4 mg via INTRAVENOUS

## 2019-10-16 MED ORDER — OXYCODONE HCL 5 MG/5ML PO SOLN: 5.0000 mg | Freq: Once | ORAL | Status: DC | PRN

## 2019-10-16 MED ORDER — POLYVINYL ALCOHOL 1.4 % OP SOLN
1.0000 [drp] | Freq: Every day | OPHTHALMIC | Status: DC | PRN
  Filled 2019-10-16: qty 15

## 2019-10-16 MED ORDER — LIDOCAINE HCL (CARDIAC) PF 100 MG/5ML IV SOSY
PREFILLED_SYRINGE | INTRAVENOUS | Status: DC | PRN
  Administered 2019-10-16: 50 mg via INTRAVENOUS

## 2019-10-16 MED ORDER — DEXAMETHASONE SODIUM PHOSPHATE 10 MG/ML IJ SOLN
INTRAMUSCULAR | Status: DC | PRN
  Administered 2019-10-16: 5 mg via INTRAVENOUS

## 2019-10-16 MED ORDER — PHENYLEPHRINE HCL (PRESSORS) 10 MG/ML IV SOLN
INTRAVENOUS | Status: DC | PRN
  Administered 2019-10-16 (×3): 200 ug via INTRAVENOUS

## 2019-10-16 NOTE — Anesthesia Post-op Follow-up Note (Signed)
Anesthesia QCDR form completed.        

## 2019-10-16 NOTE — Progress Notes (Signed)
PROGRESS NOTE  Ryan Benjamin:[id number] DOB: 06-13-1946 DOA: 10/15/2019 PCP: Marguarite Arbour, MD  HPI/Recap of past 24 hours: Ryan Benjamin is a 73 y.o. male with medical history significant for hyperlipidemia, GERD, BPH, history of nephrolithiasis who presents to the ED for evaluation of fevers.  Patient states he was in his usual state of health until earlier this afternoon (10/15/2019) when he developed new onset of fevers, chills, and some diaphoresis.  He noticed having increased urinary frequency with some burning on urination recently as well.  He has not had any abdominal or flank pain.  He denies any chest pain, palpitations, dyspnea, cough, or diarrhea.  He reports a prior history of kidney stones which were painful in the past.  He follows with urology, Dr. Lonna Cobb, as an outpatient.  He has a history of BPH for which he takes tamsulosin daily.  ED Course:  Initial vitals showed BP 147/88, pulse 120, RR 18, temp 102.6 Fahrenheit, SPO2 96% on room air.  Labs notable for WBC 8.7, hemoglobin 16.2, platelets 169,000, BUN 17, creatinine 0.99, bicarb 25, sodium 139, potassium 3.9, serum glucose 119, serum ethanol undetectable, lactic acid 1.3.  Urinalysis showed negative nitrites, small leukocytes, 21-WBCs no bacteria on microscopy.  Blood and urine cultures were obtained and pending.  Influenza panel was negative.  SARS-CoV-2 test was obtained and pending.  Portable chest x-ray was negative for acute focal consolidation, effusion, or edema.  CT renal stone study showed a distal 4 mm left ureteral stone with minimal ureteral fullness, bilateral nonobstructing renal calculi, increased right renal cyst size from prior, diverticulosis without evidence of diverticulitis.  Patient was given IV ceftriaxone and 1 L normal saline.  EDP discussed the case with on-call urology who will consult on patient for possible cystoscopy with stent placement tonight.  The hospitalist service  was consulted to admit for further evaluation management.  Review of Systems: All systems reviewed and are negative except as documented in history of present illness above.  10/16/19: Patient was seen and examined at his bedside this morning.  Reports some mild discomfort dyspnea at rest from indwelling Foley catheter.  Persistent gross hematuria noted in the Foley bag.  Seen by urology, following.  Restarted diet and resumed home medications.  Assessment/Plan: Principal Problem:   Sepsis secondary to UTI [facility]) Active Problems:   BPH (benign prostatic hyperplasia)   Left ureteral calculus   Hyperlipidemia  Sepsis secondary to complicated UTI associated with left ureteral calculus.   Presented with fever, tachycardia and CT scan suggestive of distal left ureteral calculus.   Post left ureteral stent placement by urology on 10/15/2019. Prior urine culture July 2020 grew out Enterococcus faecalis. Continue IV Levaquin 750 mg daily pending culture data Continue to follow urine culture and blood cultures. Continue IV fluid in the setting of gross hematuria. Start LR at 75 cc/h x 2 days. Monitor fever curve and WBC Obtain CBC in the morning.  Gross hematuria, improving Post urological procedure Seen by urology, following Continue to monitor Continue IV fluid hydration Continue to monitor urine output and renal function Monitor H&H  History of nephrolithiasis Follows with urology outpatient Management per urology.  Hyperlipidemia: Continue Lipitor  BPH: Continue tamsulosin  GERD Continue home PPI    DVT prophylaxis: SCDs; not on chemical DVT prophylaxis due to gross hematuria Code Status: Full code, confirmed with patient Family Communication: Discussed with patient, he is updated his brother Disposition Plan:  Likely discharge pain 24 to 48 hours  pending urine culture ID and sensitivities. Consults called: Urology    Objective: Vitals:   10/16/19 0155 10/16/19  0209 10/16/19 0427 10/16/19 1314  BP: 113/83 123/89 119/86 (!) 106/59  Pulse: 89 91 84 81  Resp: 18   20  Temp: (!) 97 F (36.1 C) 98.3 F (36.8 C) 98.2 F (36.8 C) (!) 97.5 F (36.4 C)  TempSrc:  Oral Oral Oral  SpO2: 93% 97% 95% 96%  Weight:      Height:        Intake/Output Summary (Last 24 hours) at 10/16/2019 1529 Last data filed at 10/16/2019 1355 Gross per 24 hour  Intake 2512.33 ml  Output 1000 ml  Net 1512.33 ml   Filed Weights   10/15/19 1810 10/15/19 2047  Weight: 90.7 kg 98.2 kg    Exam:   General: 73 y.o. year-old male well developed well nourished in no acute distress.  Alert and oriented x4.  Cardiovascular: Regular rate and rhythm with no rubs or gallops.  No thyromegaly or JVD noted.    Respiratory: Clear to auscultation with no wheezes or rales. Good inspiratory effort.  Abdomen: Soft nontender nondistended with normal bowel sounds x4 quadrants.  Musculoskeletal: No lower extremity edema. 2/4 pulses in all 4 extremities.  Psychiatry: Mood is appropriate for condition and setting   Data Reviewed: CBC: Recent Labs  Lab 10/15/19 1825 10/16/19 0440  WBC 8.7 8.5  NEUTROABS 6.7  --   HGB 16.2 14.4  HCT 48.5 44.8  MCV 89.6 92.2  PLT 169 161   Basic Metabolic Panel: Recent Labs  Lab 10/15/19 1825 10/16/19 0440  NA 139 136  K 3.9 4.1  CL 101 103  CO2 25 23  GLUCOSE 119* 123*  BUN 17 16  CREATININE 0.99 0.67  CALCIUM 9.2 8.2*   GFR: Estimated Creatinine Clearance: 90.4 mL/min (by C-G formula based on SCr of 0.67 mg/dL). Liver Function Tests: Recent Labs  Lab 10/15/19 1825  AST 26  ALT 25  ALKPHOS 88  BILITOT 3.6*  PROT 8.0  ALBUMIN 4.3   No results for input(s): LIPASE, AMYLASE in the last 168 hours. No results for input(s): AMMONIA in the last 168 hours. Coagulation Profile: Recent Labs  Lab 10/15/19 1933  INR 1.0   Cardiac Enzymes: No results for input(s): CKTOTAL, CKMB, CKMBINDEX, TROPONINI in the last 168  hours. BNP (last 3 results) No results for input(s): PROBNP in the last 8760 hours. HbA1C: No results for input(s): HGBA1C in the last 72 hours. CBG: No results for input(s): GLUCAP in the last 168 hours. Lipid Profile: No results for input(s): CHOL, HDL, LDLCALC, TRIG, CHOLHDL, LDLDIRECT in the last 72 hours. Thyroid Function Tests: No results for input(s): TSH, T4TOTAL, FREET4, T3FREE, THYROIDAB in the last 72 hours. Anemia Panel: No results for input(s): VITAMINB12, FOLATE, FERRITIN, TIBC, IRON, RETICCTPCT in the last 72 hours. Urine analysis:    Component Value Date/Time   COLORURINE YELLOW (A) 10/15/2019 1829   APPEARANCEUR HAZY (A) 10/15/2019 1829   APPEARANCEUR Clear 04/07/2019 1033   LABSPEC 1.019 10/15/2019 1829   LABSPEC 1.018 03/12/2015 1335   PHURINE 6.0 10/15/2019 1829   GLUCOSEU NEGATIVE 10/15/2019 1829   GLUCOSEU Negative 03/12/2015 1335   HGBUR MODERATE (A) 10/15/2019 1829   BILIRUBINUR NEGATIVE 10/15/2019 1829   BILIRUBINUR Negative 04/07/2019 1033   KETONESUR NEGATIVE 10/15/2019 1829   PROTEINUR 100 (A) 10/15/2019 1829   NITRITE NEGATIVE 10/15/2019 1829   LEUKOCYTESUR SMALL (A) 10/15/2019 1829   LEUKOCYTESUR Negative 03/12/2015  1335   Sepsis Labs: (procalcitonin:4,lacticidven:4)  ) Recent Results (from the past 240 hour(s))  Blood Culture (routine x 2)     Status: None (Preliminary result)   Collection Time: 10/15/19  7:33 PM   Specimen: BLOOD  Result Value Ref Range Status   Specimen Description BLOOD Blood Culture adequate volume  Final   Special Requests   Final    BOTTLES DRAWN AEROBIC AND ANAEROBIC RIGHT ANTECUBITAL   Culture   Final    NO GROWTH < 12 HOURS Performed at [facility], [address] [place], [place] [address]    Report Status PENDING  Incomplete  Blood Culture (routine x 2)     Status: None (Preliminary result)   Collection Time: 10/15/19  7:33 PM   Specimen: BLOOD  Result Value Ref Range Status    Specimen Description BLOOD Blood Culture adequate volume  Final   Special Requests   Final    BOTTLES DRAWN AEROBIC AND ANAEROBIC LEFT ANTECUBITAL   Culture   Final    NO GROWTH < 12 HOURS Performed at [facility], [address] [place], [place] [address]    Report Status PENDING  Incomplete  SARS Coronavirus 2 by RT PCR (hospital order, performed in [facility] hospital lab) Nasopharyngeal Nasopharyngeal Swab     Status: None   Collection Time: 10/15/19  7:33 PM   Specimen: Nasopharyngeal Swab  Result Value Ref Range Status   SARS Coronavirus 2 NEGATIVE NEGATIVE Final    Comment: (NOTE) If result is NEGATIVE SARS-CoV-2 target nucleic acids are NOT DETECTED. The SARS-CoV-2 RNA is generally detectable in upper and lower  respiratory specimens during the acute phase of infection. The lowest  concentration of SARS-CoV-2 viral copies this assay can detect is 250  copies / mL. A negative result does not preclude SARS-CoV-2 infection  and should not be used as the sole basis for treatment or other  patient management decisions.  A negative result may occur with  improper specimen collection / handling, submission of specimen other  than nasopharyngeal swab, presence of viral mutation(s) within the  areas targeted by this assay, and inadequate number of viral copies  (<250 copies / mL). A negative result must be combined with clinical  observations, patient history, and epidemiological information. If result is POSITIVE SARS-CoV-2 target nucleic acids are DETECTED. The SARS-CoV-2 RNA is generally detectable in upper and lower  respiratory specimens dur ing the acute phase of infection.  Positive  results are indicative of active infection with SARS-CoV-2.  Clinical  correlation with patient history and other diagnostic information is  necessary to determine patient infection status.  Positive results do  not rule out bacterial infection or co-infection with other viruses. If  result is PRESUMPTIVE POSTIVE SARS-CoV-2 nucleic acids MAY BE PRESENT.   A presumptive positive result was obtained on the submitted specimen  and confirmed on repeat testing.  While 2019 novel coronavirus  (SARS-CoV-2) nucleic acids may be present in the submitted sample  additional confirmatory testing may be necessary for epidemiological  and / or clinical management purposes  to differentiate between  SARS-CoV-2 and other Sarbecovirus currently known to infect humans.  If clinically indicated additional testing with an alternate test  methodology [id number]) is advised. The SARS-CoV-2 RNA is generally  detectable in upper and lower respiratory sp ecimens during the acute  phase of infection. The expected result is Negative. Fact Sheet for Patients:  BoilerBrush.com.cy Fact Sheet for Healthcare Providers: https://pope.com/ This test is  not yet approved or cleared by the [place] and has been authorized for detection and/or diagnosis of SARS-CoV-2 by FDA under an Emergency Use Authorization (EUA).  This EUA will remain in effect (meaning this test can be used) for the duration of the COVID-19 declaration under Section 564(b)(1) of the Act, 21 U.S.C. section 360bbb-3(b)(1), unless the authorization is terminated or revoked sooner. Performed at [facility], [address], [place], [place] [address]       Studies: Dg Chest [facility] 1 View  Result Date: 10/15/2019 CLINICAL DATA:  Fever. EXAM: PORTABLE CHEST 1 VIEW COMPARISON:  None. FINDINGS: The heart is normal in size. Mild aortic tortuosity. Pulmonary vasculature is normal. No consolidation, pleural effusion, or pneumothorax. No acute osseous abnormalities are seen. Left shoulder arthroplasty. IMPRESSION: No acute chest findings. Electronically Signed   By: Keith Rake M.D.   On: 10/15/2019 19:05   Dg Or Urology Cysto Image ([facility] Only)  Result Date:  10/15/2019 There is no interpretation for this exam.  This order is for images obtained during a surgical procedure.  Please See "Surgeries" Tab for more information regarding the procedure.   Ct Renal Stone Study  Result Date: 10/15/2019 CLINICAL DATA:  Hematuria and fevers with bilateral flank pain EXAM: CT ABDOMEN AND PELVIS WITHOUT CONTRAST TECHNIQUE: Multidetector CT imaging of the abdomen and pelvis was performed following the standard protocol without IV contrast. COMPARISON:  03/12/2015 FINDINGS: Lower chest: No acute abnormality. Hepatobiliary: Diffuse fatty infiltration of the liver is noted. The gallbladder is within normal limits. Pancreas: Unremarkable. No pancreatic ductal dilatation or surrounding inflammatory changes. Spleen: Normal in size without focal abnormality. Adrenals/Urinary Tract: Adrenal glands are unremarkable. The kidneys are well visualized bilaterally. Scattered small nonobstructing renal calculi are noted bilaterally left greater than right. Hypodense lesion is noted arising from the right kidney measuring approximately 3.3 cm. This has increased in size when compared with the prior exam. No obstructive changes are noted on right. The collecting system and left ureter show evidence of a 4 mm distal ureteral stone best seen on image number 76 of series 2 and image number 95 of series 5. This likely contributes to the patient's underlying discomfort. The bladder is partially distended. Stomach/Bowel: Diffuse diverticular change of the colon is seen. No findings to suggest diverticulitis are noted. The appendix is well visualized and within normal limits. No obstructive or inflammatory changes of the small bowel are seen. Small sliding-type hiatal hernia is noted. Vascular/Lymphatic: Aortic atherosclerosis. No enlarged abdominal or pelvic lymph nodes. Reproductive: Prostate is mildly prominent indenting upon the inferior aspect of the bladder. Other: No abdominal wall hernia or  abnormality. No abdominopelvic ascites. Musculoskeletal: Degenerative changes of lumbar spine are seen. No compression deformity is noted. IMPRESSION: Distal 4 mm left ureteral stone with minimal ureteral fullness. Bilateral nonobstructing renal calculi. Right renal cyst which has increased in size from the prior exam. Diverticulosis without diverticulitis. Chronic changes as described. Electronically Signed   By: Inez Catalina M.D.   On: 10/15/2019 20:23    Scheduled Meds:  aspirin EC  81 mg Oral Daily   atorvastatin  20 mg Oral q1800   Chlorhexidine Gluconate Cloth  6 each Topical Daily   loratadine  10 mg Oral QHS   pantoprazole  40 mg Oral Daily   tamsulosin  0.4 mg Oral Daily    Continuous Infusions:  levofloxacin (LEVAQUIN) IV       LOS: 1 day     Kayleen Memos, MD  [facility] Pager [phone]  If 7PM-7AM, please contact night-coverage [email or web address] Password [facility] 10/16/2019, 3:29 PM

## 2019-10-16 NOTE — Op Note (Signed)
Date of procedure: 10/16/19  Preoperative diagnosis:  1. Left distal ureteral stone, UTI, sepsis  Postoperative diagnosis:  1. Same  Procedure: 1. Cystoscopy, left retrograde pyelogram with intraoperative interpretation, left ureteral stent placement  Surgeon: Nickolas Madrid, MD  Anesthesia: General  Complications: None  Intraoperative findings:  1.  Uncomplicated left ureteral stent placement with purulent drainage  EBL: Minimal  Specimens: None  Drains: Left 6 French by 24 cm ureteral stent, 20 French two-way Foley  Indication: Ryan Benjamin is a 73 y.o. patient with fever to 103 and a left distal ureteral stone with upstream hydroureteronephrosis and concern for infected obstructed stone.  After reviewing the management options for treatment, they elected to proceed with the above surgical procedure(s). We have discussed the potential benefits and risks of the procedure, side effects of the proposed treatment, the likelihood of the patient achieving the goals of the procedure, and any potential problems that might occur during the procedure or recuperation. Informed consent has been obtained.  Description of procedure:  The patient was taken to the operating room and general anesthesia was induced. SCDs were placed for DVT prophylaxis. The patient was placed in the dorsal lithotomy position, prepped and draped in the usual sterile fashion, and preoperative antibiotics(ceftriaxone and Levaquin) were administered. A preoperative time-out was performed.   A 21 French rigid cystoscope was used to intubate the meatus and a normal-appearing urethra was followed proximally into the bladder.  The prostate was large with a high bladder neck and lateral lobes.  There was moderate bladder trabeculation.  A left retrograde pyelogram was performed which showed a filling defect in the left distal ureter consistent with his stone, with mild to moderate upstream hydroureteronephrosis.  I was  ultimately able to navigate a Glidewire alongside the stone and up into the kidney with moderate difficulty, as there was some J hooking of the ureter with his large prostate.  There was purulent drainage after the wire passed into the kidney.  A 6 French by 24 cm ureteral stent was advanced over the wire into the kidney, and there was an excellent curl in the renal pelvis as well as under direct vision the bladder.  There is purulent drainage through the side-port of the stent.  A 20 French two-way Foley was placed to maximize drainage in the setting of infection.   Disposition: Stable to PACU  Plan: Admit to hospitalist, continue resuscitation and broad-spectrum antibiotics Will need definitive management of stone in 2 to 3 weeks after infection treated Okay to remove Foley when afebrile greater than 24 hours and clinically improved  Nickolas Madrid, MD

## 2019-10-16 NOTE — Anesthesia Procedure Notes (Signed)
Procedure Name: Intubation Performed by: Rolla Plate, CRNA Pre-anesthesia Checklist: Patient identified, Patient being monitored, Timeout performed, Emergency Drugs available and Suction available Patient Re-evaluated:Patient Re-evaluated prior to induction Oxygen Delivery Method: Circle system utilized Preoxygenation: Pre-oxygenation with 100% oxygen Induction Type: IV induction and Rapid sequence Laryngoscope Size: Charlyne Petrin and 2 Grade View: Grade I Tube type: Oral Tube size: 7.5 mm Number of attempts: 1 Airway Equipment and Method: Stylet Placement Confirmation: ETT inserted through vocal cords under direct vision,  positive ETCO2 and breath sounds checked- equal and bilateral Secured at: 22 cm Tube secured with: Tape Dental Injury: Teeth and Oropharynx as per pre-operative assessment

## 2019-10-16 NOTE — Progress Notes (Signed)
Urology Inpatient Progress Note  Subjective: Ryan Benjamin is a 73 y.o. male admitted on 10/15/2019 with a fever and subsequently found to have a 5 mm distal left ureteral stone with mild left hydroureteronephrosis and UA significant for microscopic hematuria and pyuria.  He is POD1 from left ureteral stent placement with Dr. Diamantina Providence.  Creatinine 0.67 today, down from 0.99 yesterday.  WBC count stable at 8.5.  He has been afebrile since last night, otherwise VSS.  Blood cultures with no growth at <12 hours, urine culture pending.  On antibiotics as below.  Today he reports feeling well, without flank or abdominal pain.  Foley in place draining clear, light red urine.  Anti-infectives: Anti-infectives (From admission, onward)   Start     Dose/Rate Route Frequency Ordered Stop   10/15/19 2300  levofloxacin (LEVAQUIN) IVPB 750 mg     750 mg 100 mL/hr over 90 Minutes Intravenous Every 24 hours 10/15/19 2157     10/15/19 2015  cefTRIAXone (ROCEPHIN) 1 g in sodium chloride 0.9 % 100 mL IVPB     1 g 200 mL/hr over 30 Minutes Intravenous  Once 10/15/19 2012 10/15/19 2131      Current Facility-Administered Medications  Medication Dose Route Frequency Provider Last Rate Last Dose  . acetaminophen (TYLENOL) tablet 650 mg  650 mg Oral Q6H PRN Lenore Cordia, MD       Or  . acetaminophen (TYLENOL) suppository 650 mg  650 mg Rectal Q6H PRN Lenore Cordia, MD      . aspirin EC tablet 81 mg  81 mg Oral Daily Irene Pap N, DO   81 mg at 10/16/19 7035  . atorvastatin (LIPITOR) tablet 20 mg  20 mg Oral q1800 Lenore Cordia, MD      . Chlorhexidine Gluconate Cloth 2 % PADS 6 each  6 each Topical Daily Billey Co, MD   6 each at 10/16/19 [phone]  . levofloxacin (LEVAQUIN) IVPB 750 mg  750 mg Intravenous Q24H Zada Finders R, MD      . loratadine (CLARITIN) tablet 10 mg  10 mg Oral QHS Hall, Carole N, DO      . ondansetron (ZOFRAN) tablet 4 mg  4 mg Oral Q6H PRN Lenore Cordia, MD       Or  .  ondansetron (ZOFRAN) injection 4 mg  4 mg Intravenous Q6H PRN Zada Finders R, MD      . oxymetazoline (AFRIN) 0.05 % nasal spray 1 spray  1 spray Each Nare BID PRN Lenore Cordia, MD      . pantoprazole (PROTONIX) EC tablet 40 mg  40 mg Oral Daily Irene Pap N, DO   40 mg at 10/16/19 0958  . polyvinyl alcohol (LIQUIFILM TEARS) 1.4 % ophthalmic solution 1 drop  1 drop Both Eyes Daily PRN Irene Pap N, DO      . tamsulosin (FLOMAX) capsule 0.4 mg  0.4 mg Oral Daily Zada Finders R, MD   0.4 mg at 10/16/19 0949  . traMADol (ULTRAM) tablet 50 mg  50 mg Oral Q12H PRN Irene Pap N, DO       Objective: Vital signs in last 24 hours: Temp:  [96.7 F (35.9 C)-102.6 F (39.2 C)] 97.5 F (36.4 C) (11/13 1314) Pulse Rate:  [81-120] 81 (11/13 1314) Resp:  [15-22] 20 (11/13 1314) BP: (106-147)/(59-93) 106/59 (11/13 1314) SpO2:  [93 %-100 %] 96 % (11/13 1314) Weight:  [90.7 kg-98.2 kg] 98.2 kg (11/12 2047)  Intake/Output from  previous day: 11/12 0701 - 11/13 0700 In: 1500 [I.V.:500; IV Piggyback:1000] Out: -  Intake/Output this shift: Total I/O In: 1012.3 [P.O.:120; I.V.:892.3] Out: 1000 [Urine:1000]  Physical Exam Vitals signs and nursing note reviewed.  Constitutional:      General: He is not in acute distress.    Appearance: Normal appearance. He is not ill-appearing, toxic-appearing or diaphoretic.  HENT:     Head: Normocephalic and atraumatic.  Pulmonary:     Effort: Pulmonary effort is normal. No respiratory distress.  Skin:    General: Skin is warm and dry.  Neurological:     Mental Status: He is alert and oriented to person, place, and time.  Psychiatric:        Mood and Affect: Mood normal.        Behavior: Behavior normal.    Lab Results:  Recent Labs    10/15/19 1825 10/16/19 0440  WBC 8.7 8.5  HGB 16.2 14.4  HCT 48.5 44.8  PLT 169 161   BMET Recent Labs    10/15/19 1825 10/16/19 0440  NA 139 136  K 3.9 4.1  CL 101 103  CO2 25 23  GLUCOSE 119* 123*   BUN 17 16  CREATININE 0.99 0.67  CALCIUM 9.2 8.2*   PT/INR Recent Labs    10/15/19 1933  LABPROT 13.1  INR 1.0   Assessment & Plan: 73 year old male admitted with a distal left ureteral stone, POD 1 from left ureteral stent placement with Dr. Richardo Hanks.  Patient clinically improving.  Recommend discontinue Foley tomorrow morning if he remains afebrile.  Continue IV fluids and broad-spectrum antibiotics, will continue to follow urine and blood cultures.  He will require outpatient definitive stone management in 2 to 3 weeks following completion of UTI treatment (scheduling in progress).  Carman Ching, PA-C 10/16/2019

## 2019-10-16 NOTE — Anesthesia Postprocedure Evaluation (Signed)
Anesthesia Post Note  Patient: Ryan Benjamin  Procedure(s) Performed: CYSTOSCOPY WITH STENT PLACEMENT (Left )  Patient location during evaluation: PACU Anesthesia Type: General Level of consciousness: awake and alert Pain management: pain level controlled Vital Signs Assessment: post-procedure vital signs reviewed and stable Respiratory status: spontaneous breathing, nonlabored ventilation and respiratory function stable Cardiovascular status: blood pressure returned to baseline and stable Postop Assessment: no apparent nausea or vomiting Anesthetic complications: no     Last Vitals:  Vitals:   10/16/19 0155 10/16/19 0209  BP: 113/83 123/89  Pulse: 89 91  Resp: 18   Temp: (!) 36.1 C 36.8 C  SpO2: 93% 97%    Last Pain:  Vitals:   10/16/19 0210  TempSrc:   PainSc: 0-No pain                 Durenda Hurt

## 2019-10-16 NOTE — Transfer of Care (Signed)
Immediate Anesthesia Transfer of Care Note  Patient: Ryan Benjamin  Procedure(s) Performed: CYSTOSCOPY WITH STENT PLACEMENT (Left )  Patient Location: PACU  Anesthesia Type:General  Level of Consciousness: sedated  Airway & Oxygen Therapy: Patient Spontanous Breathing and Patient connected to face mask oxygen  Post-op Assessment: Report given to RN and Post -op Vital signs reviewed and stable  Post vital signs: Reviewed  Last Vitals:  Vitals Value Taken Time  BP 106/93 10/16/19 0110  Temp    Pulse 106 10/16/19 0111  Resp 17 10/16/19 0111  SpO2 92 % 10/16/19 0111  Vitals shown include unvalidated device data.  Last Pain:  Vitals:   10/15/19 2110  TempSrc: Oral  PainSc:          Complications: No apparent anesthesia complications

## 2019-10-17 LAB — CBC
HCT: 43.8 % (ref 39.0–52.0)
Hemoglobin: 14.8 g/dL (ref 13.0–17.0)
MCH: 29.8 pg (ref 26.0–34.0)
MCHC: 33.8 g/dL (ref 30.0–36.0)
MCV: 88.3 fL (ref 80.0–100.0)
Platelets: 172 10*3/uL (ref 150–400)
RBC: 4.96 MIL/uL (ref 4.22–5.81)
RDW: 12.7 % (ref 11.5–15.5)
WBC: 9.3 10*3/uL (ref 4.0–10.5)
nRBC: 0 % (ref 0.0–0.2)

## 2019-10-17 LAB — BASIC METABOLIC PANEL
Anion gap: 7 (ref 5–15)
BUN: 17 mg/dL (ref 8–23)
CO2: 27 mmol/L (ref 22–32)
Calcium: 8.9 mg/dL (ref 8.9–10.3)
Chloride: 106 mmol/L (ref 98–111)
Creatinine, Ser: 0.72 mg/dL (ref 0.61–1.24)
GFR calc Af Amer: >60 mL/min (ref >60)
GFR calc non Af Amer: >60 mL/min (ref >60)
Glucose, Bld: 104 mg/dL — ABNORMAL HIGH (ref 70–99)
Potassium: 4.4 mmol/L (ref 3.5–5.1)
Sodium: 140 mmol/L (ref 135–145)

## 2019-10-17 NOTE — Progress Notes (Signed)
Physical Therapy Evaluation Patient Details Name: Ryan Benjamin MRN: [id number] DOB: [birth year] Today's Date: 10/17/2019   History of Present Illness  Ryan Benjamin is a 73 y.o. male with medical history significant for hyperlipidemia, GERD, BPH, history of nephrolithiasis who presents to the ED for evaluation of fevers.  Clinical Impression  Patient is independent with all mobility including bed mobility, transfers, gait without AD. He has no strength deficits or balance deficits. He reports no pain. He ambulates 600 feet without AD. He lives alone in level entry home. He has no skilled PT needs at this time and will be DC from PT.     Follow Up Recommendations No PT follow up    Equipment Recommendations  None recommended by PT    Recommendations for Other Services       Precautions / Restrictions Precautions Precautions: None Restrictions Weight Bearing Restrictions: No      Mobility  Bed Mobility Overal bed mobility: Independent                Transfers Overall transfer level: Independent Equipment used: None                Ambulation/Gait Ambulation/Gait assistance: Independent Gait Distance (Feet): 600 Feet Assistive device: IV Pole;None Gait Pattern/deviations: Step-through pattern     General Gait Details: (no path deviations,)  Stairs            Wheelchair Mobility    Modified Rankin (Stroke Patients Only)       Balance Overall balance assessment: Independent                                           Pertinent Vitals/Pain Pain Assessment: No/denies pain    Home Living Family/patient expects to be discharged to:: Private residence Living Arrangements: Alone   Type of Home: House Home Access: Level entry     Home Layout: One level        Prior Function Level of Independence: Independent               Hand Dominance        Extremity/Trunk Assessment   Upper Extremity Assessment Upper  Extremity Assessment: Overall WFL for tasks assessed    Lower Extremity Assessment Lower Extremity Assessment: Overall WFL for tasks assessed       Communication   Communication: No difficulties  Cognition Arousal/Alertness: Awake/alert Behavior During Therapy: WFL for tasks assessed/performed                                          General Comments      Exercises     Assessment/Plan    PT Assessment Patent does not need any further PT services  PT Problem List         PT Treatment Interventions      PT Goals (Current goals can be found in the Care Plan section)  Acute Rehab PT Goals PT Goal Formulation: All assessment and education complete, DC therapy    Frequency     Barriers to discharge        Co-evaluation               AM-PAC PT "6 Clicks" Mobility  Outcome Measure Help needed turning from your back to your side  while in a flat bed without using bedrails?: None Help needed moving from lying on your back to sitting on the side of a flat bed without using bedrails?: None Help needed moving to and from a bed to a chair (including a wheelchair)?: None Help needed standing up from a chair using your arms (e.g., wheelchair or bedside chair)?: None Help needed to walk in hospital room?: None Help needed climbing 3-5 steps with a railing? : None 6 Click Score: 24    End of Session Equipment Utilized During Treatment: Gait belt Activity Tolerance: Patient tolerated treatment well Patient left: in chair;with call bell/phone within reach Nurse Communication: Mobility status      Time: 1410-1420 PT Time Calculation (min) (ACUTE ONLY): 10 min   Charges:   PT Evaluation $PT Eval Low Complexity: 1 Low           [place], Sharion Settler, PT DPT 10/17/2019, 2:52 PM

## 2019-10-17 NOTE — Progress Notes (Addendum)
PROGRESS NOTE  Ryan Benjamin:[id number] DOB: July 21, 1946 DOA: 10/15/2019 PCP: Marguarite Arbour, MD  HPI/Recap of past 24 hours: Ryan Benjamin is a 73 y.o. male with medical history significant for hyperlipidemia, GERD, BPH, history of nephrolithiasis who presents to the ED for evaluation of fevers.  Patient states he was in his usual state of health until earlier this afternoon (10/15/2019) when he developed new onset of fevers, chills, and some diaphoresis.  He noticed having increased urinary frequency with some burning on urination recently as well.  He has not had any abdominal or flank pain.  He denies any chest pain, palpitations, dyspnea, cough, or diarrhea.  He reports a prior history of kidney stones which were painful in the past.  He follows with urology, Dr. Lonna Cobb, as an outpatient.  He has a history of BPH for which he takes tamsulosin daily.  ED Course:  Initial vitals showed BP 147/88, pulse 120, RR 18, temp 102.6 Fahrenheit, SPO2 96% on room air.  Labs notable for WBC 8.7, hemoglobin 16.2, platelets 169,000, BUN 17, creatinine 0.99, bicarb 25, sodium 139, potassium 3.9, serum glucose 119, serum ethanol undetectable, lactic acid 1.3.  Urinalysis showed negative nitrites, small leukocytes, 21-WBCs no bacteria on microscopy.  Blood and urine cultures were obtained and pending.  Influenza panel was negative.  SARS-CoV-2 test was obtained and pending.  Portable chest x-ray was negative for acute focal consolidation, effusion, or edema.  CT renal stone study showed a distal 4 mm left ureteral stone with minimal ureteral fullness, bilateral nonobstructing renal calculi, increased right renal cyst size from prior, diverticulosis without evidence of diverticulitis.  Patient was given IV ceftriaxone and 1 L normal saline.  EDP discussed the case with on-call urology who will consult on patient for possible cystoscopy with stent placement tonight.  The hospitalist service  was consulted to admit for further evaluation management.  Review of Systems: All systems reviewed and are negative except as documented in history of present illness above.  10/16/19: Patient was seen and examined at his bedside this morning.  Reports some mild discomfort dyspnea at rest from indwelling Foley catheter.  Persistent gross hematuria noted in the Foley bag.  Seen by urology, following.  Restarted diet and resumed home medications.  10/17/19: Patient was seen and examined at his bedside this morning.  No acute events overnight.  He reports discomfort at his meatus.  Afebrile overnight we will remove his Foley catheter.  Daily as recommended by urology.  Urine culture reintubated for better growth.  Blood cultures negative x2 days.  Assessment/Plan: Principal Problem:   Sepsis secondary to UTI [facility]) Active Problems:   BPH (benign prostatic hyperplasia)   Left ureteral calculus   Hyperlipidemia  Sepsis secondary to complicated UTI associated with left ureteral calculus.   Presented with fever, tachycardia and CT scan suggestive of distal left ureteral calculus.   Post left ureteral stent placement by urology on 10/15/2019. Prior urine culture July 2020 grew out Enterococcus faecalis. Continue IV Levaquin 750 mg daily pending culture data Continue to follow urine culture and blood cultures. Continue IV fluid in the setting of gross hematuria. C/ LR at 75 cc/h x 2 days. C/ to Monitor fever curve and WBC  Resolving gross hematuria, improving Post urological procedure Seen by urology, following Continue to monitor Continue IV fluid hydration Continue to monitor urine output and renal function Monitor H&H Remove foley cath on 10/17/19.  History of nephrolithiasis Follows with urology outpatient Management per urology.  Hyperlipidemia: Continue Lipitor  BPH: Continue tamsulosin  GERD Continue home PPI    DVT prophylaxis: SCDs; not on chemical DVT prophylaxis  due to gross hematuria Code Status: Full code, confirmed with patient Family Communication: Discussed with patient, he is updated his brother Disposition Plan:  Likely discharge pain 24 to 48 hours pending urine culture ID and sensitivities. Consults called: Urology    Objective: Vitals:   10/16/19 1314 10/16/19 1948 10/17/19 0551 10/17/19 1156  BP: (!) 106/59 116/69 111/80 131/74  Pulse: 81 71 (!) 57 72  Resp: 20 14  20   Temp: (!) 97.5 F (36.4 C) 98.4 F (36.9 C) 98 F (36.7 C) 97.8 F (36.6 C)  TempSrc: Oral Oral Oral Oral  SpO2: 96% 96% 97% 96%  Weight:      Height:        Intake/Output Summary (Last 24 hours) at 10/17/2019 1311 Last data filed at 10/17/2019 1234 Gross per 24 hour  Intake 1485.71 ml  Output 2971 ml  Net -1485.29 ml   Filed Weights   10/15/19 1810 10/15/19 2047  Weight: 90.7 kg 98.2 kg    Exam:  . General: 73 y.o. year-old male well-developed well-nourished in no acute distress.  Alert and oriented x4.   . Cardiovascular: Regular rate and rhythm no rubs or gallops. Marland Kitchen. Respiratory: Clear to auscultation no wheezes or rales.   . Abdomen: Soft nontender nondistended normal bowel sounds present.   . Musculoskeletal: No lower extremity edema.  Peripheral pulses normal.   . Psychiatry: Mood is appropriate for condition and setting.   Data Reviewed: CBC: Recent Labs  Lab 10/15/19 1825 10/16/19 0440 10/17/19 0635  WBC 8.7 8.5 9.3  NEUTROABS 6.7  --   --   HGB 16.2 14.4 14.8  HCT 48.5 44.8 43.8  MCV 89.6 92.2 88.3  PLT 169 161 172   Basic Metabolic Panel: Recent Labs  Lab 10/15/19 1825 10/16/19 0440 10/17/19 0635  NA 139 136 140  K 3.9 4.1 4.4  CL 101 103 106  CO2 25 23 27   GLUCOSE 119* 123* 104*  BUN 17 16 17   CREATININE 0.99 0.67 0.72  CALCIUM 9.2 8.2* 8.9   GFR: Estimated Creatinine Clearance: 90.4 mL/min (by C-G formula based on SCr of 0.72 mg/dL). Liver Function Tests: Recent Labs  Lab 10/15/19 1825  AST 26  ALT 25   ALKPHOS 88  BILITOT 3.6*  PROT 8.0  ALBUMIN 4.3   No results for input(s): LIPASE, AMYLASE in the last 168 hours. No results for input(s): AMMONIA in the last 168 hours. Coagulation Profile: Recent Labs  Lab 10/15/19 1933  INR 1.0   Cardiac Enzymes: No results for input(s): CKTOTAL, CKMB, CKMBINDEX, TROPONINI in the last 168 hours. BNP (last 3 results) No results for input(s): PROBNP in the last 8760 hours. HbA1C: No results for input(s): HGBA1C in the last 72 hours. CBG: No results for input(s): GLUCAP in the last 168 hours. Lipid Profile: No results for input(s): CHOL, HDL, LDLCALC, TRIG, CHOLHDL, LDLDIRECT in the last 72 hours. Thyroid Function Tests: No results for input(s): TSH, T4TOTAL, FREET4, T3FREE, THYROIDAB in the last 72 hours. Anemia Panel: No results for input(s): VITAMINB12, FOLATE, FERRITIN, TIBC, IRON, RETICCTPCT in the last 72 hours. Urine analysis:    Component Value Date/Time   COLORURINE YELLOW (A) 10/15/2019 1829   APPEARANCEUR HAZY (A) 10/15/2019 1829   APPEARANCEUR Clear 04/07/2019 1033   LABSPEC 1.019 10/15/2019 1829   LABSPEC 1.018 03/12/2015 1335   PHURINE 6.0 10/15/2019  [place] 10/15/2019 1829   GLUCOSEU Negative 03/12/2015 1335   HGBUR MODERATE (A) 10/15/2019 [place] 10/15/2019 1829   BILIRUBINUR Negative 04/07/2019 [place] 10/15/2019 1829   PROTEINUR 100 (A) 10/15/2019 1829   NITRITE NEGATIVE 10/15/2019 1829   LEUKOCYTESUR SMALL (A) 10/15/2019 1829   LEUKOCYTESUR Negative 03/12/2015 1335   Sepsis Labs: @LABRCNTIP (procalcitonin:4,lacticidven:4)  ) Recent Results (from the past 240 hour(s))  Urine culture     Status: None (Preliminary result)   Collection Time: 10/15/19  6:29 PM   Specimen: In/Out Cath Urine  Result Value Ref Range Status   Specimen Description   Final    IN/OUT CATH URINE Performed at [facility], [address], [place], [place] [address]     Special Requests   Final    NONE Performed at [facility], [address] [place], [place] [address]    Culture   Final    CULTURE REINCUBATED FOR BETTER GROWTH Performed at [facility], [place] [address], [place], [place] [address]    Report Status PENDING  Incomplete  Blood Culture (routine x 2)     Status: None (Preliminary result)   Collection Time: 10/15/19  7:33 PM   Specimen: BLOOD  Result Value Ref Range Status   Specimen Description BLOOD Blood Culture adequate volume  Final   Special Requests   Final    BOTTLES DRAWN AEROBIC AND ANAEROBIC RIGHT ANTECUBITAL   Culture   Final    NO GROWTH 2 DAYS Performed at [facility], [address] [place], [place] [address]    Report Status PENDING  Incomplete  Blood Culture (routine x 2)     Status: None (Preliminary result)   Collection Time: 10/15/19  7:33 PM   Specimen: BLOOD  Result Value Ref Range Status   Specimen Description BLOOD Blood Culture adequate volume  Final   Special Requests   Final    BOTTLES DRAWN AEROBIC AND ANAEROBIC LEFT ANTECUBITAL   Culture   Final    NO GROWTH 2 DAYS Performed at [facility], [address], [place], [place] [address]    Report Status PENDING  Incomplete  SARS Coronavirus 2 by RT PCR (hospital order, performed in [place] hospital lab) Nasopharyngeal Nasopharyngeal Swab     Status: None   Collection Time: 10/15/19  7:33 PM   Specimen: Nasopharyngeal Swab  Result Value Ref Range Status   SARS Coronavirus 2 NEGATIVE NEGATIVE Final    Comment: (NOTE) If result is NEGATIVE SARS-CoV-2 target nucleic acids are NOT DETECTED. The SARS-CoV-2 RNA is generally detectable in upper and lower  respiratory specimens during the acute phase of infection. The lowest  concentration of SARS-CoV-2 viral copies this assay can detect is 250  copies / mL. A negative result does not preclude SARS-CoV-2 infection  and should not be used as the sole basis for  treatment or other  patient management decisions.  A negative result may occur with  improper specimen collection / handling, submission of specimen other  than nasopharyngeal swab, presence of viral mutation(s) within the  areas targeted by this assay, and inadequate number of viral copies  (<250 copies / mL). A negative result must be combined with clinical  observations, patient history, and epidemiological information. If result is POSITIVE SARS-CoV-2 target nucleic acids are DETECTED. The SARS-CoV-2 RNA is generally detectable in upper and lower  respiratory specimens dur ing the acute phase of  infection.  Positive  results are indicative of active infection with SARS-CoV-2.  Clinical  correlation with patient history and other diagnostic information is  necessary to determine patient infection status.  Positive results do  not rule out bacterial infection or co-infection with other viruses. If result is PRESUMPTIVE POSTIVE SARS-CoV-2 nucleic acids MAY BE PRESENT.   A presumptive positive result was obtained on the submitted specimen  and confirmed on repeat testing.  While 2019 novel coronavirus  (SARS-CoV-2) nucleic acids may be present in the submitted sample  additional confirmatory testing may be necessary for epidemiological  and / or clinical management purposes  to differentiate between  SARS-CoV-2 and other Sarbecovirus currently known to infect humans.  If clinically indicated additional testing with an alternate test  methodology [phone]) is advised. The SARS-CoV-2 RNA is generally  detectable in upper and lower respiratory sp ecimens during the acute  phase of infection. The expected result is Negative. Fact Sheet for Patients:  BoilerBrush.com.cy Fact Sheet for Healthcare Providers: https://pope.com/ This test is not yet approved or cleared by the [place] FDA and has been authorized for detection and/or  diagnosis of SARS-CoV-2 by FDA under an Emergency Use Authorization (EUA).  This EUA will remain in effect (meaning this test can be used) for the duration of the COVID-19 declaration under Section 564(b)(1) of the Act, 21 U.S.C. section 360bbb-3(b)(1), unless the authorization is terminated or revoked sooner. Performed at [facility], [address], [place], [place] [address]       Studies: No results found.  Scheduled Meds: . aspirin EC  81 mg Oral Daily  . atorvastatin  20 mg Oral q1800  . Chlorhexidine Gluconate Cloth  6 each Topical Daily  . loratadine  10 mg Oral QHS  . pantoprazole  40 mg Oral Daily  . tamsulosin  0.4 mg Oral Daily    Continuous Infusions: . lactated ringers 75 mL/hr at 10/17/19 0600  . levofloxacin (LEVAQUIN) IV Stopped (10/16/19 2351)     LOS: 2 days     Darlin Drop, MD [facility] Pager [phone]  If 7PM-7AM, please contact night-coverage [email or web address] Password [email or web address] 10/17/2019, 1:11 PM

## 2019-10-17 NOTE — Progress Notes (Signed)
OT Screen Note  Patient Details Name: Ryan Benjamin MRN: [id number] DOB: 02-Nov-1946   Cancelled Treatment:    Reason Eval/Treat Not Completed: OT screened, no needs identified, will sign off. Consult received, chart reviewed. Upon meeting pt, pt pleasant and politely declined functional deficits or impairments. Pt demo's no functional deficits, no skilled OT needs at this time. Will sign off. Please re-consult if additional needs arise.   Jeni Salles, MPH, MS, OTR/L ascom [phone] 10/17/19, 3:07 PM

## 2019-10-18 LAB — URINE CULTURE: Culture: 10000 — AB

## 2019-10-18 MED ORDER — LEVOFLOXACIN 500 MG PO TABS: 500.0000 mg | ORAL_TABLET | Freq: Every day | ORAL | 0 refills | Status: AC

## 2019-10-18 MED ORDER — LEVOFLOXACIN 500 MG PO TABS
500.0000 mg | ORAL_TABLET | Freq: Every day | ORAL | Status: DC
  Filled 2019-10-18: qty 1

## 2019-10-18 MED ORDER — TAMSULOSIN HCL 0.4 MG PO CAPS: 0.4000 mg | ORAL_CAPSULE | Freq: Every day | ORAL | 0 refills | Status: AC

## 2019-10-18 NOTE — Discharge Summary (Signed)
Discharge Summary  Ryan Benjamin JQB:[id number] DOB: 16-Dec-1945  PCP: Marguarite Arbour, MD  Admit date: 10/15/2019 Discharge date: 10/18/2019  Time spent: 35 minutes  Recommendations for Outpatient Follow-up:  1. Follow-up with urology 2. Follow-up with your PCP 3. Take your medications as prescribed   Discharge Diagnoses:  Active Hospital Problems   Diagnosis Date Noted   Sepsis secondary to UTI [facility]) 10/15/2019   Left ureteral calculus 10/15/2019   Hyperlipidemia 10/15/2019   BPH (benign prostatic hyperplasia) 04/16/2019    Resolved Hospital Problems  No resolved problems to display.    Discharge Condition: Stable  Diet recommendation: Resume previous diet  Vitals:   10/17/19 2111 10/18/19 0552  BP: 112/71 122/84  Pulse: 79 75  Resp: 18 18  Temp: 97.7 F (36.5 C) 97.9 F (36.6 C)  SpO2: 97% 97%    History of present illness:   Ryan Benjamin a 73 y.o.malewith medical history significant forhyperlipidemia, GERD, BPH, history of nephrolithiasis who presents to the ED for evaluation of fevers.  He reports a prior history of kidney stones. He follows with urology, Dr. Lonna Cobb, as an outpatient. He has a history of BPH for which he takes tamsulosin daily.  ED Course: Initial vitals showed BP 147/88, pulse 120, RR 18, temp 102.6 Fahrenheit, SPO2 96% on room air. Urinalysis showed negative nitrites, small leukocytes, 21-WBCs no bacteria on microscopy. Blood and urine cultures were obtained.  Blood cultures negative to date.  Urine culture positive for Enterococcus faecalis, pansensitive.  Influenza panel and COVID-19 negative.  Chest x-ray no acute abnormalities.    CT renal stone study showed a distal 4 mm left ureteral stone with minimal ureteral fullness, bilateral nonobstructing renal calculi, increased right renal cyst size from prior, diverticulosis without evidence of diverticulitis.  Patient was given IV ceftriaxone and 1 L normal saline. EDP  discussed the case with on-call urology who will consult on patient for possible cystoscopy with stent placement tonight. The hospitalist service was consulted to admit for further evaluation management.  Post left ureteral stent placement with Dr. Gabrielle Dare on 10/15/2019.  Tolerated well.  10/18/19: Patient was seen and examined at his bedside this morning.  No acute events overnight.  Urine appears clear.  He has no new complaints.  Blood cultures reviewed and negative to date.  Urine culture positive for Enterococcus faecalis, pansensitive.  Received 4 days of IV antibiotics.  Switched to p.o. Levaquin 500 mg daily x7 days 10/18/2019.    Hospital Course:  Principal Problem:   Sepsis secondary to UTI [facility]) Active Problems:   BPH (benign prostatic hyperplasia)   Left ureteral calculus   Hyperlipidemia   Enterococcus faecalis complicated UTI Sepsis secondary to complicated UTI associated with left ureteral calculus.   Presented with fever, tachycardia and CT scan suggestive of distal left ureteral calculus.   Post left ureteral stent placement by urology on 10/15/2019. Prior urine culture July 2020 grew out Enterococcus faecalis.  Urine culture taken on 10/15/2019 grew Enterococcus faecalis, pansensitive Completed 4 days of IV antibiotics Fluids to p.o. Levaquin 500 mg daily x7 days on 10/18/2019. Sepsis physiology has resolved. Vital signs reviewed and are stable, afebrile with no leukocytosis. Follow-up with urology outpatient  Resolved gross hematuria Post urological procedure Seen by urology, recommended follow-up outpatient At the time of this visit on 10/18/2019 urine is clear in appearance.  No sign of gross bleeding or hematuria.  History of nephrolithiasis Follows with urology outpatient Management per urology.  Hyperlipidemia: Continue Lipitor  BPH:  Continue tamsulosin Follow-up with urology  GERD Continue home PPI   Code Status:Full code, confirmed with  patient  Consults called:Urology   Discharge Exam: BP 122/84 (BP Location: Left Arm)    Pulse 75    Temp 97.9 F (36.6 C) (Oral)    Resp 18    Ht  (1.702 m)    Wt 98.2 kg    SpO2 97%    BMI 33.91 kg/m   General: 73 y.o. year-old male well developed well nourished in no acute distress.  Alert and oriented x4.  Cardiovascular: Regular rate and rhythm with no rubs or gallops.  No thyromegaly or JVD noted.    Respiratory: Clear to auscultation with no wheezes or rales. Good inspiratory effort.  Abdomen: Soft nontender nondistended with normal bowel sounds x4 quadrants.  Musculoskeletal: No lower extremity edema. 2/4 pulses in all 4 extremities.  Psychiatry: Mood is appropriate for condition and setting  Discharge Instructions You were cared for by a hospitalist during your hospital stay. If you have any questions about your discharge medications or the care you received while you were in the hospital after you are discharged, you can call the unit and asked to speak with the hospitalist on call if the hospitalist that took care of you is not available. Once you are discharged, your primary care physician will handle any further medical issues. Please note that NO REFILLS for any discharge medications will be authorized once you are discharged, as it is imperative that you return to your primary care physician (or establish a relationship with a primary care physician if you do not have one) for your aftercare needs so that they can reassess your need for medications and monitor your lab values.   Allergies as of 10/18/2019   No Known Allergies     Medication List    STOP taking these medications   acetaminophen 650 MG CR tablet Commonly known as: TYLENOL   traMADol 50 MG tablet Commonly known as: ULTRAM     TAKE these medications   aspirin EC 81 MG tablet Take 81 mg by mouth daily.   atorvastatin 20 MG tablet Commonly known as: LIPITOR Take 20 mg by mouth at  bedtime.   esomeprazole 20 MG capsule Commonly known as: NEXIUM Take 20 mg by mouth daily.   levofloxacin 500 MG tablet Commonly known as: LEVAQUIN Take 1 tablet (500 mg total) by mouth daily at 8 pm for 7 days.   loratadine 10 MG tablet Commonly known as: CLARITIN Take 10 mg by mouth at bedtime.   LUBRICATING EYE DROPS OP Place 1 drop into both eyes daily as needed (dry eyes).   oxymetazoline 0.05 % nasal spray Commonly known as: AFRIN Place 1 spray into both nostrils 2 (two) times daily as needed for congestion.   tamsulosin 0.4 MG Caps capsule Commonly known as: FLOMAX Take 1 capsule (0.4 mg total) by mouth daily.   Viagra 100 MG tablet Generic drug: sildenafil Take 100 mg by mouth daily as needed for erectile dysfunction.      No Known Allergies Follow-up Information    Marguarite Arbour, MD. Call in 1 day(s).   Specialty: Internal Medicine Why: Please call for a post hospital follow-up appointment. Contact information: [address] [place] [address] [phone]        Sondra Come, MD. Call in 1 day(s).   Specialty: Urology Why: Please call for a post hospital follow-up appointment. Contact information: [address]  [place] [address] [phone]        Riki Altes, MD. Call in 1 day(s).   Specialty: Urology Why: Please call for a post hospital follow-up appointment Contact information: [address] [place] [address] [phone]            The results of significant diagnostics from this hospitalization (including imaging, microbiology, ancillary and laboratory) are listed below for reference.    Significant Diagnostic Studies: Dg Chest Port 1 View  Result Date: 10/15/2019 CLINICAL DATA:  Fever. EXAM: PORTABLE CHEST 1 VIEW COMPARISON:  None. FINDINGS: The heart is normal in size. Mild aortic tortuosity. Pulmonary vasculature is normal. No consolidation, pleural effusion, or  pneumothorax. No acute osseous abnormalities are seen. Left shoulder arthroplasty. IMPRESSION: No acute chest findings. Electronically Signed   By: Narda Rutherford M.D.   On: 10/15/2019 19:05   Dg Or Urology Cysto Image ([facility] Only)  Result Date: 10/15/2019 There is no interpretation for this exam.  This order is for images obtained during a surgical procedure.  Please See "Surgeries" Tab for more information regarding the procedure.   Ct Renal Stone Study  Result Date: 10/15/2019 CLINICAL DATA:  Hematuria and fevers with bilateral flank pain EXAM: CT ABDOMEN AND PELVIS WITHOUT CONTRAST TECHNIQUE: Multidetector CT imaging of the abdomen and pelvis was performed following the standard protocol without IV contrast. COMPARISON:  03/12/2015 FINDINGS: Lower chest: No acute abnormality. Hepatobiliary: Diffuse fatty infiltration of the liver is noted. The gallbladder is within normal limits. Pancreas: Unremarkable. No pancreatic ductal dilatation or surrounding inflammatory changes. Spleen: Normal in size without focal abnormality. Adrenals/Urinary Tract: Adrenal glands are unremarkable. The kidneys are well visualized bilaterally. Scattered small nonobstructing renal calculi are noted bilaterally left greater than right. Hypodense lesion is noted arising from the right kidney measuring approximately 3.3 cm. This has increased in size when compared with the prior exam. No obstructive changes are noted on right. The collecting system and left ureter show evidence of a 4 mm distal ureteral stone best seen on image number 76 of series 2 and image number 95 of series 5. This likely contributes to the patient's underlying discomfort. The bladder is partially distended. Stomach/Bowel: Diffuse diverticular change of the colon is seen. No findings to suggest diverticulitis are noted. The appendix is well visualized and within normal limits. No obstructive or inflammatory changes of the small bowel are seen. Small  sliding-type hiatal hernia is noted. Vascular/Lymphatic: Aortic atherosclerosis. No enlarged abdominal or pelvic lymph nodes. Reproductive: Prostate is mildly prominent indenting upon the inferior aspect of the bladder. Other: No abdominal wall hernia or abnormality. No abdominopelvic ascites. Musculoskeletal: Degenerative changes of lumbar spine are seen. No compression deformity is noted. IMPRESSION: Distal 4 mm left ureteral stone with minimal ureteral fullness. Bilateral nonobstructing renal calculi. Right renal cyst which has increased in size from the prior exam. Diverticulosis without diverticulitis. Chronic changes as described. Electronically Signed   By: Alcide Clever M.D.   On: 10/15/2019 20:23    Microbiology: Recent Results (from the past 240 hour(s))  Urine culture     Status: Abnormal   Collection Time: 10/15/19  6:29 PM   Specimen: In/Out Cath Urine  Result Value Ref Range Status   Specimen Description   Final    IN/OUT CATH URINE Performed at [facility], [address], [place], [place] [address]    Special Requests   Final    NONE Performed at [facility], [address]  [place], [place] [address]    Culture 10,000 COLONIES/mL ENTEROCOCCUS FAECALIS (A)  Final   Report Status 10/18/2019 FINAL  Final   Organism ID, Bacteria ENTEROCOCCUS FAECALIS (A)  Final      Susceptibility   Enterococcus faecalis - MIC*    AMPICILLIN <=2 SENSITIVE Sensitive     LEVOFLOXACIN 1 SENSITIVE Sensitive     NITROFURANTOIN <=16 SENSITIVE Sensitive     VANCOMYCIN 1 SENSITIVE Sensitive     * 10,000 COLONIES/mL ENTEROCOCCUS FAECALIS  Blood Culture (routine x 2)     Status: None (Preliminary result)   Collection Time: 10/15/19  7:33 PM   Specimen: BLOOD  Result Value Ref Range Status   Specimen Description BLOOD Blood Culture adequate volume  Final   Special Requests   Final    BOTTLES DRAWN AEROBIC AND ANAEROBIC RIGHT ANTECUBITAL   Culture   Final    NO GROWTH 2  DAYS Performed at [facility], [address] [place], [place] [address]    Report Status PENDING  Incomplete  Blood Culture (routine x 2)     Status: None (Preliminary result)   Collection Time: 10/15/19  7:33 PM   Specimen: BLOOD  Result Value Ref Range Status   Specimen Description BLOOD Blood Culture adequate volume  Final   Special Requests   Final    BOTTLES DRAWN AEROBIC AND ANAEROBIC LEFT ANTECUBITAL   Culture   Final    NO GROWTH 2 DAYS Performed at [facility], [address] [place], [place] [address]    Report Status PENDING  Incomplete  SARS Coronavirus 2 by RT PCR (hospital order, performed in [facility] hospital lab) Nasopharyngeal Nasopharyngeal Swab     Status: None   Collection Time: 10/15/19  7:33 PM   Specimen: Nasopharyngeal Swab  Result Value Ref Range Status   SARS Coronavirus 2 NEGATIVE NEGATIVE Final    Comment: (NOTE) If result is NEGATIVE SARS-CoV-2 target nucleic acids are NOT DETECTED. The SARS-CoV-2 RNA is generally detectable in upper and lower  respiratory specimens during the acute phase of infection. The lowest  concentration of SARS-CoV-2 viral copies this assay can detect is 250  copies / mL. A negative result does not preclude SARS-CoV-2 infection  and should not be used as the sole basis for treatment or other  patient management decisions.  A negative result may occur with  improper specimen collection / handling, submission of specimen other  than nasopharyngeal swab, presence of viral mutation(s) within the  areas targeted by this assay, and inadequate number of viral copies  (<250 copies / mL). A negative result must be combined with clinical  observations, patient history, and epidemiological information. If result is POSITIVE SARS-CoV-2 target nucleic acids are DETECTED. The SARS-CoV-2 RNA is generally detectable in upper and lower  respiratory specimens dur ing the acute phase of infection.  Positive   results are indicative of active infection with SARS-CoV-2.  Clinical  correlation with patient history and other diagnostic information is  necessary to determine patient infection status.  Positive results do  not rule out bacterial infection or co-infection with other viruses. If result is PRESUMPTIVE POSTIVE SARS-CoV-2 nucleic acids MAY BE PRESENT.   A presumptive positive result was obtained on the submitted specimen  and confirmed on repeat testing.  While 2019 novel coronavirus  (SARS-CoV-2) nucleic acids may be present in the submitted sample  additional confirmatory testing may be necessary for epidemiological  and / or clinical management purposes  to differentiate  between  SARS-CoV-2 and other Sarbecovirus currently known to infect humans.  If clinically indicated additional testing with an alternate test  methodology [id number]) is advised. The SARS-CoV-2 RNA is generally  detectable in upper and lower respiratory sp ecimens during the acute  phase of infection. The expected result is Negative. Fact Sheet for Patients:  StrictlyIdeas.no Fact Sheet for Healthcare Providers: BankingDealers.co.za This test is not yet approved or cleared by the [place] FDA and has been authorized for detection and/or diagnosis of SARS-CoV-2 by FDA under an Emergency Use Authorization (EUA).  This EUA will remain in effect (meaning this test can be used) for the duration of the COVID-19 declaration under Section 564(b)(1) of the Act, 21 U.S.C. section 360bbb-3(b)(1), unless the authorization is terminated or revoked sooner. Performed at [facility], [address] [place], [place] [address]      Labs: Basic Metabolic Panel: Recent Labs  Lab 10/15/19 1825 10/16/19 0440 10/17/19 0635  NA 139 136 140  K 3.9 4.1 4.4  CL 101 103 106  CO2 25 23 27   GLUCOSE 119* 123* 104*  BUN 17 16 17   CREATININE 0.99 0.67 0.72  CALCIUM  9.2 8.2* 8.9   Liver Function Tests: Recent Labs  Lab 10/15/19 1825  AST 26  ALT 25  ALKPHOS 88  BILITOT 3.6*  PROT 8.0  ALBUMIN 4.3   No results for input(s): LIPASE, AMYLASE in the last 168 hours. No results for input(s): AMMONIA in the last 168 hours. CBC: Recent Labs  Lab 10/15/19 1825 10/16/19 0440 10/17/19 0635  WBC 8.7 8.5 9.3  NEUTROABS 6.7  --   --   HGB 16.2 14.4 14.8  HCT 48.5 44.8 43.8  MCV 89.6 92.2 88.3  PLT 169 161 172   Cardiac Enzymes: No results for input(s): CKTOTAL, CKMB, CKMBINDEX, TROPONINI in the last 168 hours. BNP: BNP (last 3 results) No results for input(s): BNP in the last 8760 hours.  ProBNP (last 3 results) No results for input(s): PROBNP in the last 8760 hours.  CBG: No results for input(s): GLUCAP in the last 168 hours.     Signed:  Kayleen Memos, MD [facility] 10/18/2019, 10:11 AM

## 2019-10-18 NOTE — Discharge Instructions (Signed)
Hematuria, Adult Hematuria is blood in the urine. Blood may be visible in the urine, or it may be identified with a test. This condition can be caused by infections of the bladder, urethra, kidney, or prostate. Other possible causes include:  Kidney stones.  Cancer of the urinary tract.  Too much calcium in the urine.  Conditions that are passed from parent to child (inherited conditions).  Exercise that requires a lot of energy. Infections can usually be treated with medicine, and a kidney stone usually will pass through your urine. If neither of these is the cause of your hematuria, more tests may be needed to identify the cause of your symptoms. It is very important to tell your health care provider about any blood in your urine, even if it is painless or the blood stops without treatment. Blood in the urine, when it happens and then stops and then happens again, can be a symptom of a very serious condition, including cancer. There is no pain in the initial stages of many urinary cancers. Follow these instructions at home: Medicines  Take over-the-counter and prescription medicines only as told by your health care provider.  If you were prescribed an antibiotic medicine, take it as told by your health care provider. Do not stop taking the antibiotic even if you start to feel better. Eating and drinking  Drink enough fluid to keep your urine clear or pale yellow. It is recommended that you drink 3-4 quarts (2.8-3.8 L) a day. If you have been diagnosed with an infection, it is recommended that you drink cranberry juice in addition to large amounts of water.  Avoid caffeine, tea, and carbonated beverages. These tend to irritate the bladder.  Avoid alcohol because it may irritate the prostate (men). General instructions  If you have been diagnosed with a kidney stone, follow your health care provider's instructions about straining your urine to catch the stone.  Empty your bladder  often. Avoid holding urine for long periods of time.  If you are male: ? After a bowel movement, wipe from front to back and use each piece of toilet paper only once. ? Empty your bladder before and after sex.  Pay attention to any changes in your symptoms. Tell your health care provider about any changes or any new symptoms.  It is your responsibility to get your test results. Ask your health care provider, or the department performing the test, when your results will be ready.  Keep all follow-up visits as told by your health care provider. This is important. Contact a health care provider if:  You develop back pain.  You have a fever.  You have nausea or vomiting.  Your symptoms do not improve after 3 days.  Your symptoms get worse. Get help right away if:  You develop severe vomiting and are unable take medicine without vomiting.  You develop severe pain in your back or abdomen even though you are taking medicine.  You pass a large amount of blood in your urine.  You pass blood clots in your urine.  You feel very weak or like you might faint.  You faint. Summary  Hematuria is blood in the urine. It has many possible causes.  It is very important that you tell your health care provider about any blood in your urine, even if it is painless or the blood stops without treatment.  Take over-the-counter and prescription medicines only as told by your health care provider.  Drink enough fluid to keep  your urine clear or pale yellow. This information is not intended to replace advice given to you by your health care provider. Make sure you discuss any questions you have with your health care provider. Document Released: 11/19/2005 Document Revised: 11/01/2017 Document Reviewed: 12/22/2016 Elsevier Patient Education  2020 [employer].  Urinary Tract Infection, Adult A urinary tract infection (UTI) is an infection of any part of the urinary tract. The urinary tract  includes:  The kidneys.  The ureters.  The bladder.  The urethra. These organs make, store, and get rid of pee (urine) in the body. What are the causes? This is caused by germs (bacteria) in your genital area. These germs grow and cause swelling (inflammation) of your urinary tract. What increases the risk? You are more likely to develop this condition if:  You have a small, thin tube (catheter) to drain pee.  You cannot control when you pee or poop (incontinence).  You are male, and: ? You use these methods to prevent pregnancy: ? A medicine that kills sperm (spermicide). ? A device that blocks sperm (diaphragm). ? You have low levels of a male hormone (estrogen). ? You are pregnant.  You have genes that add to your risk.  You are sexually active.  You take antibiotic medicines.  You have trouble peeing because of: ? A prostate that is bigger than normal, if you are male. ? A blockage in the part of your body that drains pee from the bladder (urethra). ? A kidney stone. ? A nerve condition that affects your bladder (neurogenic bladder). ? Not getting enough to drink. ? Not peeing often enough.  You have other conditions, such as: ? Diabetes. ? A weak disease-fighting system (immune system). ? Sickle cell disease. ? Gout. ? Injury of the spine. What are the signs or symptoms? Symptoms of this condition include:  Needing to pee right away (urgently).  Peeing often.  Peeing small amounts often.  Pain or burning when peeing.  Blood in the pee.  Pee that smells bad or not like normal.  Trouble peeing.  Pee that is cloudy.  Fluid coming from the vagina, if you are male.  Pain in the belly or lower back. Other symptoms include:  Throwing up (vomiting).  No urge to eat.  Feeling mixed up (confused).  Being tired and grouchy (irritable).  A fever.  Watery poop (diarrhea). How is this treated? This condition may be treated  with:  Antibiotic medicine.  Other medicines.  Drinking enough water. Follow these instructions at home:  Medicines  Take over-the-counter and prescription medicines only as told by your doctor.  If you were prescribed an antibiotic medicine, take it as told by your doctor. Do not stop taking it even if you start to feel better. General instructions  Make sure you: ? Pee until your bladder is empty. ? Do not hold pee for a long time. ? Empty your bladder after sex. ? Wipe from front to back after pooping if you are a male. Use each tissue one time when you wipe.  Drink enough fluid to keep your pee pale yellow.  Keep all follow-up visits as told by your doctor. This is important. Contact a doctor if:  You do not get better after 1-2 days.  Your symptoms go away and then come back. Get help right away if:  You have very bad back pain.  You have very bad pain in your lower belly.  You have a fever.  You are  sick to your stomach (nauseous).  You are throwing up. Summary  A urinary tract infection (UTI) is an infection of any part of the urinary tract.  This condition is caused by germs in your genital area.  There are many risk factors for a UTI. These include having a small, thin tube to drain pee and not being able to control when you pee or poop.  Treatment includes antibiotic medicines for germs.  Drink enough fluid to keep your pee pale yellow. This information is not intended to replace advice given to you by your health care provider. Make sure you discuss any questions you have with your health care provider. Document Released: 05/07/2008 Document Revised: 11/06/2018 Document Reviewed: 05/29/2018 Elsevier Patient Education  2020 [employer]  Acute Urinary Retention, Male  Acute urinary retention means that you cannot pee (urinate) at all, or that you pee too little and your bladder is not emptied completely. If it is not treated, it can lead to  kidney damage or other serious problems. Follow these instructions at home:  Take over-the-counter and prescription medicines only as told by your doctor. Ask your doctor what medicines you should stay away from. Do not take any medicine unless your doctor says it is okay to do so.  If you were sent home with a tube that drains the bladder (catheter), take care of it as told by your doctor.  Drink enough fluid to keep your pee clear or pale yellow.  If you were given an antibiotic, take it as told by your doctor. Do not stop taking the antibiotic even if you start to feel better.  Do not use any products that contain nicotine or tobacco, such as cigarettes and e-cigarettes. If you need help quitting, ask your doctor.  Watch for changes in your symptoms. Tell your doctor about them.  If told, track changes in your blood pressure at home. Tell your doctor about them.  Keep all follow-up visits as told by your doctor. This is important. Contact a doctor if:  You have spasms or you leak pee when you have spasms. Get help right away if:  You have chills or a fever.  You have a tube that drains the bladder and: ? The tube stops draining pee. ? The tube falls out.  You have blood in your pee. Summary  Acute urinary retention means that you have problems peeing. It may mean that you cannot pee at all, or that you pee too little.  If this condition is not treated, it can lead to kidney damage or other serious problems.  If you were sent home with a tube that drains the bladder, take care of it as told by your doctor.  Monitor any changes in your symptoms. Tell your doctor about any changes. This information is not intended to replace advice given to you by your health care provider. Make sure you discuss any questions you have with your health care provider. Document Released: 05/07/2008 Document Revised: 02/05/2019 Document Reviewed: 12/21/2016 Elsevier Patient Education  2020  [employer].

## 2019-10-20 ENCOUNTER — Telehealth: Payer: Self-pay | Admitting: Urology

## 2019-10-20 LAB — CULTURE, BLOOD (ROUTINE X 2)
Culture: NO GROWTH
Culture: NO GROWTH
Specimen Description: ADEQUATE
Specimen Description: ADEQUATE

## 2019-10-20 NOTE — Telephone Encounter (Signed)
Pt called and asked for a call back to discuss surgery for a kidney stone.

## 2019-10-20 NOTE — Telephone Encounter (Signed)
Left ureteroscopy, laser lithotripsy, stent change in ~2-3 weeks with me or [facility](I stented him on call for UTI w stone but he is a regular [facility] patient). Levoquin/Ceftriaxone for pre-op abx, needs UA and culture prior to surgery. Does not need clearance, ok to continue aspirin. Follow up for stent removal in one week  Thanks Nickolas Madrid, MD 10/20/2019

## 2019-10-20 NOTE — Telephone Encounter (Signed)
Dr Diamantina Providence, can you give me orders for this?

## 2019-10-23 ENCOUNTER — Other Ambulatory Visit: Payer: Self-pay | Admitting: Family Medicine

## 2019-10-23 DIAGNOSIS — N401 Enlarged prostate with lower urinary tract symptoms: Secondary | ICD-10-CM

## 2019-10-23 DIAGNOSIS — N200 Calculus of kidney: Secondary | ICD-10-CM

## 2019-10-26 ENCOUNTER — Other Ambulatory Visit: Payer: Medicare Other

## 2019-10-26 ENCOUNTER — Other Ambulatory Visit: Payer: Self-pay

## 2019-10-26 ENCOUNTER — Other Ambulatory Visit: Payer: Self-pay | Admitting: Urology

## 2019-10-26 DIAGNOSIS — N201 Calculus of ureter: Secondary | ICD-10-CM

## 2019-10-26 DIAGNOSIS — N200 Calculus of kidney: Secondary | ICD-10-CM

## 2019-10-26 DIAGNOSIS — N401 Enlarged prostate with lower urinary tract symptoms: Secondary | ICD-10-CM

## 2019-10-26 LAB — URINALYSIS, COMPLETE
Bilirubin, UA: NEGATIVE
Glucose, UA: NEGATIVE
Ketones, UA: NEGATIVE
Leukocytes,UA: NEGATIVE
Nitrite, UA: NEGATIVE
Specific Gravity, UA: 1.025 (ref 1.005–1.030)
Urobilinogen, Ur: 0.2 mg/dL (ref 0.2–1.0)
pH, UA: 6 (ref 5.0–7.5)

## 2019-10-26 LAB — MICROSCOPIC EXAMINATION

## 2019-10-27 ENCOUNTER — Telehealth: Payer: Self-pay | Admitting: Urology

## 2019-10-27 ENCOUNTER — Other Ambulatory Visit: Payer: Self-pay

## 2019-10-27 ENCOUNTER — Encounter
Admission: RE | Admit: 2019-10-27 | Discharge: 2019-10-27 | Disposition: A | Discharge status: Home / Self Care | Payer: Medicare Other | Source: Ambulatory Visit | Attending: Urology | Admitting: Urology

## 2019-10-27 HISTORY — DX: Calculus of kidney: N20.0

## 2019-10-27 HISTORY — DX: Dyspnea, unspecified: R06.00

## 2019-10-27 HISTORY — DX: Other allergic rhinitis: J30.89

## 2019-10-27 HISTORY — DX: Urinary tract infection, site not specified: N39.0

## 2019-10-27 NOTE — Telephone Encounter (Signed)
Pt. States he would like for any post op medication to be sent/called in the day before surgery due to difficulty having someone pick up his medication for him. Pt. States he would like to pick it up himself and request it to be sent to [facility] on the corner of [address].

## 2019-10-27 NOTE — Patient Instructions (Addendum)
Your procedure is scheduled on: 12/08/2019 Tues Report to Same Day Surgery 2nd floor medical mall [facility] Entrance-take elevator on left to 2nd floor.  Check in with surgery information desk.) To find out your arrival time please call [phone] between 1PM - 3PM on 12/07/2019 Mon  Remember: Instructions that are not followed completely may result in serious medical risk, up to and including death, or upon the discretion of your surgeon and anesthesiologist your surgery may need to be rescheduled.    _x___ 1. Do not eat food after midnight the night before your procedure. You may drink clear liquids up to 2 hours before you are scheduled to arrive at the hospital for your procedure.  Do not drink clear liquids within 2 hours of your scheduled arrival to the hospital.  Clear liquids include  --Water or Apple juice without pulp  --Clear carbohydrate beverage such as ClearFast or Gatorade  --Black Coffee or Clear Tea (No milk, no creamers, do not add anything to                  the coffee or Tea Type 1 and type 2 diabetics should only drink water.   ____Ensure clear carbohydrate drink on the way to the hospital for bariatric patients  ____Ensure clear carbohydrate drink 3 hours before surgery.   No gum chewing or hard candies.     __x__ 2. No Alcohol for 24 hours before or after surgery.   __x__3. No Smoking or e-cigarettes for 24 prior to surgery.  Do not use any chewable tobacco products for at least 6 hour prior to surgery   ____  4. Bring all medications with you on the day of surgery if instructed.    __x__ 5. Notify your doctor if there is any change in your medical condition     (cold, fever, infections).    x___6. On the morning of surgery brush your teeth with toothpaste and water.  You may rinse your mouth with mouth wash if you wish.  Do not swallow any toothpaste or mouthwash.   Do not wear jewelry, make-up, hairpins, clips or nail polish.  Do not wear lotions,  powders, or perfumes. You may wear deodorant.  Do not shave 48 hours prior to surgery. Men may shave face and neck.  Do not bring valuables to the hospital.    [facility] is not responsible for any belongings or valuables.               Contacts, dentures or bridgework may not be worn into surgery.  Leave your suitcase in the car. After surgery it may be brought to your room.  For patients admitted to the hospital, discharge time is determined by your                       treatment team.  _  Patients discharged the day of surgery will not be allowed to drive home.  You will need someone to drive you home and stay with you the night of your procedure.    Please read over the following fact sheets that you were given:   [facility] Preparing for Surgery and or MRSA Information   _x___ Take anti-hypertensive listed below, cardiac, seizure, asthma,     anti-reflux and psychiatric medicines. These include:  1. esomeprazole (NEXIUM) 20 MG capsule  2.tamsulosin (FLOMAX) 0.4 MG CAPS capsule  3.  4.  5.  6.  ____Fleets enema or Magnesium Citrate  as directed.   _x___ Use CHG Soap or sage wipes as directed on instruction sheet   ____ Use inhalers on the day of surgery and bring to hospital day of surgery  ____ Stop Metformin and Janumet 2 days prior to surgery.    ____ Take 1/2 of usual insulin dose the night before surgery and none on the morning     surgery.   _x___ Follow recommendations from Cardiologist, Pulmonologist or PCP regarding          stopping Aspirin, Coumadin, Plavix ,Eliquis, Effient, or Pradaxa, and Pletal.  X____Stop Anti-inflammatories such as Advil, Aleve, Ibuprofen, Motrin, Naproxen, Naprosyn, Goodies powders or aspirin products. OK to take Tylenol and                          Celebrex.   _x___ Stop supplements until after surgery.  But may continue Vitamin D, Vitamin B,       and multivitamin.   ____ Bring C-Pap to the hospital.

## 2019-10-30 ENCOUNTER — Other Ambulatory Visit: Admission: RE | Admit: 2019-10-30 | Payer: Medicare Other | Source: Ambulatory Visit

## 2019-10-30 LAB — CULTURE, URINE COMPREHENSIVE

## 2019-11-02 NOTE — Telephone Encounter (Signed)
Patient developed symptoms prior to PAT and was tested for Covid by PCP and was positive.  Will need to cancel/reschedule with myself or Dr. Diamantina Providence.

## 2019-11-13 ENCOUNTER — Other Ambulatory Visit: Payer: Medicare Other

## 2019-11-19 ENCOUNTER — Ambulatory Visit: Payer: Medicare Other | Admitting: Urology

## 2019-11-23 NOTE — Addendum Note (Signed)
Encounter addended by: Melanee Left, RN on: 11/23/2019 12:14 PM  Actions taken: Clinical Note Signed

## 2019-11-30 ENCOUNTER — Other Ambulatory Visit: Payer: Self-pay

## 2019-11-30 DIAGNOSIS — Z01818 Encounter for other preprocedural examination: Secondary | ICD-10-CM

## 2019-12-01 ENCOUNTER — Other Ambulatory Visit: Payer: Self-pay

## 2019-12-01 ENCOUNTER — Other Ambulatory Visit: Payer: Medicare Other

## 2019-12-01 DIAGNOSIS — Z01818 Encounter for other preprocedural examination: Secondary | ICD-10-CM

## 2019-12-03 ENCOUNTER — Other Ambulatory Visit: Admission: RE | Admit: 2019-12-03 | Payer: Medicare Other | Source: Ambulatory Visit

## 2019-12-04 LAB — CULTURE, URINE COMPREHENSIVE

## 2019-12-07 MED ORDER — SODIUM CHLORIDE 0.9 % IV SOLN
1.0000 g | INTRAVENOUS | Status: AC
  Administered 2019-12-08: 1 g via INTRAVENOUS
  Filled 2019-12-07: qty 1

## 2019-12-07 MED ORDER — LEVOFLOXACIN IN D5W 500 MG/100ML IV SOLN
500.0000 mg | INTRAVENOUS | Status: AC
  Administered 2019-12-08: 500 mg via INTRAVENOUS

## 2019-12-08 ENCOUNTER — Encounter
Admission: RE | Disposition: A | Discharge status: Home / Self Care | Payer: Self-pay | Source: Home / Self Care | Attending: Urology

## 2019-12-08 ENCOUNTER — Other Ambulatory Visit: Payer: Self-pay

## 2019-12-08 ENCOUNTER — Ambulatory Visit: Payer: Medicare PPO | Admitting: Certified Registered Nurse Anesthetist

## 2019-12-08 ENCOUNTER — Encounter: Payer: Self-pay | Admitting: Urology

## 2019-12-08 ENCOUNTER — Ambulatory Visit
Admission: RE | Admit: 2019-12-08 | Discharge: 2019-12-08 | Disposition: A | Discharge status: Home / Self Care | Payer: Medicare PPO | Attending: Urology | Admitting: Urology

## 2019-12-08 ENCOUNTER — Ambulatory Visit: Payer: Medicare PPO

## 2019-12-08 DIAGNOSIS — N202 Calculus of kidney with calculus of ureter: Secondary | ICD-10-CM | POA: Diagnosis present

## 2019-12-08 DIAGNOSIS — Z87442 Personal history of urinary calculi: Secondary | ICD-10-CM | POA: Insufficient documentation

## 2019-12-08 DIAGNOSIS — K219 Gastro-esophageal reflux disease without esophagitis: Secondary | ICD-10-CM | POA: Diagnosis not present

## 2019-12-08 DIAGNOSIS — N201 Calculus of ureter: Secondary | ICD-10-CM

## 2019-12-08 DIAGNOSIS — Z79899 Other long term (current) drug therapy: Secondary | ICD-10-CM | POA: Diagnosis not present

## 2019-12-08 DIAGNOSIS — E785 Hyperlipidemia, unspecified: Secondary | ICD-10-CM | POA: Insufficient documentation

## 2019-12-08 DIAGNOSIS — M1990 Unspecified osteoarthritis, unspecified site: Secondary | ICD-10-CM | POA: Insufficient documentation

## 2019-12-08 DIAGNOSIS — Z7982 Long term (current) use of aspirin: Secondary | ICD-10-CM | POA: Diagnosis not present

## 2019-12-08 DIAGNOSIS — N200 Calculus of kidney: Secondary | ICD-10-CM

## 2019-12-08 HISTORY — PX: CYSTOSCOPY/URETEROSCOPY/HOLMIUM LASER/STENT PLACEMENT: SHX6546

## 2019-12-08 SURGERY — CYSTOSCOPY/URETEROSCOPY/HOLMIUM LASER/STENT PLACEMENT
Anesthesia: General | Laterality: Left

## 2019-12-08 MED ORDER — LIDOCAINE HCL (PF) 2 % IJ SOLN
INTRAMUSCULAR | Status: AC
  Filled 2019-12-08: qty 5

## 2019-12-08 MED ORDER — URIBEL 118 MG PO CAPS: 1.0000 | ORAL_CAPSULE | Freq: Three times a day (TID) | ORAL | 0 refills | Status: DC | PRN

## 2019-12-08 MED ORDER — DEXAMETHASONE SODIUM PHOSPHATE 10 MG/ML IJ SOLN
INTRAMUSCULAR | Status: AC
  Filled 2019-12-08: qty 1

## 2019-12-08 MED ORDER — LIDOCAINE HCL (CARDIAC) PF 100 MG/5ML IV SOSY
PREFILLED_SYRINGE | INTRAVENOUS | Status: DC | PRN
  Administered 2019-12-08: 100 mg via INTRAVENOUS

## 2019-12-08 MED ORDER — SODIUM CHLORIDE 0.9 % IV SOLN: INTRAVENOUS | Status: DC

## 2019-12-08 MED ORDER — SEVOFLURANE IN SOLN
RESPIRATORY_TRACT | Status: AC
  Filled 2019-12-08: qty 250

## 2019-12-08 MED ORDER — EPHEDRINE SULFATE 50 MG/ML IJ SOLN
INTRAMUSCULAR | Status: AC
  Filled 2019-12-08: qty 1

## 2019-12-08 MED ORDER — FENTANYL CITRATE (PF) 100 MCG/2ML IJ SOLN
INTRAMUSCULAR | Status: DC | PRN
  Administered 2019-12-08: 25 ug via INTRAVENOUS

## 2019-12-08 MED ORDER — PROPOFOL 10 MG/ML IV BOLUS
INTRAVENOUS | Status: AC
  Filled 2019-12-08: qty 20

## 2019-12-08 MED ORDER — DEXAMETHASONE SODIUM PHOSPHATE 10 MG/ML IJ SOLN
INTRAMUSCULAR | Status: DC | PRN
  Administered 2019-12-08: 10 mg via INTRAVENOUS

## 2019-12-08 MED ORDER — ONDANSETRON HCL 4 MG/2ML IJ SOLN
INTRAMUSCULAR | Status: DC | PRN
  Administered 2019-12-08: 4 mg via INTRAVENOUS

## 2019-12-08 MED ORDER — IOHEXOL 180 MG/ML  SOLN
INTRAMUSCULAR | Status: DC | PRN
  Administered 2019-12-08: 08:00:00 20 mL

## 2019-12-08 MED ORDER — ONDANSETRON HCL 4 MG/2ML IJ SOLN
INTRAMUSCULAR | Status: AC
  Filled 2019-12-08: qty 2

## 2019-12-08 MED ORDER — PHENYLEPHRINE HCL (PRESSORS) 10 MG/ML IV SOLN
INTRAVENOUS | Status: DC | PRN
  Administered 2019-12-08 (×4): 200 ug via INTRAVENOUS
  Administered 2019-12-08 (×3): 100 ug via INTRAVENOUS
  Administered 2019-12-08: 200 ug via INTRAVENOUS
  Administered 2019-12-08 (×2): 100 ug via INTRAVENOUS
  Administered 2019-12-08: 200 ug via INTRAVENOUS

## 2019-12-08 MED ORDER — PHENYLEPHRINE HCL (PRESSORS) 10 MG/ML IV SOLN
INTRAVENOUS | Status: AC
  Filled 2019-12-08: qty 1

## 2019-12-08 MED ORDER — ROCURONIUM BROMIDE 50 MG/5ML IV SOLN
INTRAVENOUS | Status: AC
  Filled 2019-12-08: qty 1

## 2019-12-08 MED ORDER — LEVOFLOXACIN IN D5W 500 MG/100ML IV SOLN
INTRAVENOUS | Status: AC
  Filled 2019-12-08: qty 100

## 2019-12-08 MED ORDER — PROPOFOL 10 MG/ML IV BOLUS
INTRAVENOUS | Status: DC | PRN
  Administered 2019-12-08: 180 mg via INTRAVENOUS

## 2019-12-08 MED ORDER — SODIUM CHLORIDE (PF) 0.9 % IJ SOLN
INTRAMUSCULAR | Status: AC
  Filled 2019-12-08: qty 10

## 2019-12-08 MED ORDER — FENTANYL CITRATE (PF) 100 MCG/2ML IJ SOLN
INTRAMUSCULAR | Status: AC
  Filled 2019-12-08: qty 2

## 2019-12-08 MED ORDER — SODIUM CHLORIDE FLUSH 0.9 % IV SOLN
INTRAVENOUS | Status: AC
  Filled 2019-12-08: qty 10

## 2019-12-08 MED ORDER — EPHEDRINE SULFATE 50 MG/ML IJ SOLN
INTRAMUSCULAR | Status: DC | PRN
  Administered 2019-12-08: 5 mg via INTRAVENOUS
  Administered 2019-12-08: 10 mg via INTRAVENOUS
  Administered 2019-12-08: 5 mg via INTRAVENOUS
  Administered 2019-12-08: 10 mg via INTRAVENOUS
  Administered 2019-12-08: 5 mg via INTRAVENOUS

## 2019-12-08 SURGICAL SUPPLY — 29 items
BAG DRAIN CYSTO-URO LG1000N (MISCELLANEOUS) ×3 IMPLANT
BASKET ZERO TIP 1.9FR (BASKET) ×3 IMPLANT
BRUSH SCRUB EZ 1% IODOPHOR (MISCELLANEOUS) ×3 IMPLANT
CATH URETL 5X70 OPEN END (CATHETERS) IMPLANT
CNTNR SPEC 2.5X3XGRAD LEK (MISCELLANEOUS) ×1
CONT SPEC 4OZ STER OR WHT (MISCELLANEOUS) ×2
CONTAINER SPEC 2.5X3XGRAD LEK (MISCELLANEOUS) ×1 IMPLANT
DRAPE UTILITY 15X26 TOWEL STRL (DRAPES) ×3 IMPLANT
FIBER LASER TRAC TIP (UROLOGICAL SUPPLIES) ×3 IMPLANT
GLOVE BIO SURGEON STRL SZ8 (GLOVE) ×3 IMPLANT
GOWN STRL REUS W/ TWL LRG LVL3 (GOWN DISPOSABLE) ×1 IMPLANT
GOWN STRL REUS W/ TWL XL LVL3 (GOWN DISPOSABLE) ×1 IMPLANT
GOWN STRL REUS W/TWL LRG LVL3 (GOWN DISPOSABLE) ×2
GOWN STRL REUS W/TWL XL LVL3 (GOWN DISPOSABLE) ×2
GUIDEWIRE STR DUAL SENSOR (WIRE) ×6 IMPLANT
INFUSOR MANOMETER BAG 3000ML (MISCELLANEOUS) ×3 IMPLANT
INTRODUCER DILATOR DOUBLE (INTRODUCER) IMPLANT
KIT TURNOVER CYSTO (KITS) ×3 IMPLANT
PACK CYSTO AR (MISCELLANEOUS) ×3 IMPLANT
SET CYSTO W/LG BORE CLAMP LF (SET/KITS/TRAYS/PACK) ×3 IMPLANT
SHEATH URETERAL 12FR 45CM (SHEATH) ×3 IMPLANT
SHEATH URETERAL 12FRX35CM (MISCELLANEOUS) IMPLANT
SOL .9 NS 3000ML IRR  AL (IV SOLUTION) ×2
SOL .9 NS 3000ML IRR UROMATIC (IV SOLUTION) ×1 IMPLANT
STENT URET 6FRX24 CONTOUR (STENTS) IMPLANT
STENT URET 6FRX26 CONTOUR (STENTS) IMPLANT
SURGILUBE 2OZ TUBE FLIPTOP (MISCELLANEOUS) ×3 IMPLANT
VALVE UROSEAL ADJ ENDO (VALVE) ×3 IMPLANT
WATER STERILE IRR 1000ML POUR (IV SOLUTION) ×3 IMPLANT

## 2019-12-08 NOTE — Discharge Instructions (Signed)
AMBULATORY SURGERY  DISCHARGE INSTRUCTIONS   1) The drugs that you were given will stay in your system until tomorrow so for the next 24 hours you should not:  A) Drive an automobile B) Make any legal decisions C) Drink any alcoholic beverage   2) You may resume regular meals tomorrow.  Today it is better to start with liquids and gradually work up to solid foods.  You may eat anything you prefer, but it is better to start with liquids, then soup and crackers, and gradually work up to solid foods.   3) Please notify your doctor immediately if you have any unusual bleeding, trouble breathing, redness and pain at the surgery site, drainage, fever, or pain not relieved by medication.    4) Additional Instructions:  DISCHARGE INSTRUCTIONS FOR KIDNEY STONE/URETERAL STENT   MEDICATIONS:  1. Resume all your other meds from home.  2.  Ninfa Linden is for bladder irritation, Rx was sent to your pharmacy.  ACTIVITY:  1. May resume regular activities in 24 hours. 2. No driving while on narcotic pain medications  3. Drink plenty of water  4. Continue to walk at home - you can still get blood clots when you are at home, so keep active, but don't over do it.  5. May return to work/school tomorrow or when you feel ready   BATHING:  1. You can shower. 2. You have a string coming from your urethra: The stent string is attached to your ureteral stent. Do not pull on this.   SIGNS/SYMPTOMS TO CALL:  Please call us if you have a fever greater than 101.5, uncontrolled nausea/vomiting, uncontrolled pain, dizziness, unable to urinate, excessively bloody urine, chest pain, shortness of breath, leg swelling, leg pain, or any other concerns or questions.   Common postoperative symptoms include urinary frequency, urgency, bladder spasm and blood in the urine.  You can reach [place] at [phone].   FOLLOW-UP:  1. You have a follow-up appointment scheduled 1/21. 2. You have a string attached to your  stent, you may remove it on Friday 1/8. To do this, pull the string until the stent is completely removed. You may feel an odd sensation in your back.  You also be scheduled for an appointment to have your stent removed in the office on Friday if you are not comfortable removing yourself.         Please contact your physician with any problems or Same Day Surgery at [phone], Monday through Friday 6 am to 4 pm, or [place] at [facility] number at [phone].

## 2019-12-08 NOTE — Op Note (Signed)
Preoperative diagnosis:  1.  Left distal ureteral calculus 2.  Left nephrolithiasis  Postoperative diagnosis: Same  Procedure:  1. Cystoscopy 2. Left ureteroscopy and stone removal 3. Ureteroscopic laser lithotripsy 4. Left ureteral stent exchange (6FR) 24 cm 5. Left retrograde pyelography with interpretation  Surgeon: Lorin Picket C. Makayah Pauli, M.D.  Anesthesia: General  Complications: None  Intraoperative findings:  1.  Left retrograde pyelography post procedure showed no filling defects, stone fragments or contrast extravasation  2.~6 mm left distal ureteral calculus  3. ~3 mm left upper calyceal calculus and 2 mm left lower calyceal calculus  EBL: Minimal  Specimens: 1. Calculus fragments for analysis   Indication: Ryan Benjamin is a 74 y.o. year old patient who presented to the ED in early November 2020 with fever to 103 degrees and was found to have pyuria and an obstructing left distal ureteral calculus.  He underwent urgent stenting and presents today for definitive stone management.  His CT also showed bilateral calyceal calculi.  On the left side the largest calculus measured approximately 3 mm.  After reviewing the management options for treatment, the patient elected to proceed with the above surgical procedure(s). We have discussed the potential benefits and risks of the procedure, side effects of the proposed treatment, the likelihood of the patient achieving the goals of the procedure, and any potential problems that might occur during the procedure or recuperation. Informed consent has been obtained.  Description of procedure:  The patient was taken to the operating room and general anesthesia was induced.  The patient was placed in the dorsal lithotomy position, prepped and draped in the usual sterile fashion, and preoperative antibiotics were administered. A preoperative time-out was performed.   A 21 French cystoscope was lubricated and passed under direct vision.  The  urethra was normal in caliber without stricture.  The prostate demonstrated lateral lobe enlargement with moderate bladder neck elevation.  Panendoscopy was performed and the bladder mucosa showed no erythema, solid or papillary lesions.  There were inflammatory changes secondary to the indwelling stent.  The indwelling ureteral stent was grasped with endoscopic forceps and brought out to the urethral meatus.  A 0.038 sensor wire was then placed the stent and advance proximally under fluoroscopic guidance followed by stent removal.  A 4.5 Fr semirigid ureteroscope was then advanced into the ureter next to the guidewire and the calculus was identified in the distal ureter.  The stone was then fragmented with a 200 micron holmium laser fiber on a setting of 0.2 J and frequency of 20 hz.   All fragments were then removed from the ureter with a zero tip nitinol basket.  Reinspection of the ureter revealed no remaining visible stones or fragments.   A second sensor wire was placed through the ureteroscope and the ureteroscope was removed.  A 12/14 French ureteral access sheath was then placed over the working wire and easily passed under fluoroscopic guidance.  A single channel digital flexible ureteroscope was then placed to the access sheath and advanced into the collecting system.  Contrast was instilled through the ureteroscope and all calyces were examined with findings as described above.  There were multiple Randall's plaques noted in addition to the 2 calyceal calculi which were both removed with the 0 tip nitinol basket  The ureteroscope and sheath were removed removed in tandem and no ureteral injuries were identified.  A 6 FR/24 CM stent was placed under fluoroscopic guidance.  The wire was then removed with an adequate stent curl  noted in the renal pelvis as well as in the bladder.  The cystoscope sheath was placed without difficulty and the bladder was emptied.  After anesthetic reversal  the patient was transported to the PACU in stable condition.   Plan: A tether was left on the ureteral stent.  He was not comfortable removing the stent himself and will be scheduled for office visit Friday 1/8 for stent removal.   [name], MD

## 2019-12-08 NOTE — Interval H&P Note (Signed)
History and Physical Interval Note:  12/08/2019 7:23 AM  Ryan Benjamin  has presented today for surgery, with the diagnosis of Left Ureteral Stone.  The various methods of treatment have been discussed with the patient and family. After consideration of risks, benefits and other options for treatment, the patient has consented to  Procedure(s): CYSTOSCOPY/URETEROSCOPY/HOLMIUM LASER/STENT PLACEMENT (Left) as a surgical intervention.  The patient's history has been reviewed, patient examined, no change in status, stable for surgery.  I have reviewed the patient's chart and labs.  Questions were answered to the patient's satisfaction.     Dominiq Fontaine C Keshana Klemz

## 2019-12-08 NOTE — H&P (Signed)
History physical addendum.  Date of H&P should be 12/08/2019. Lungs clear, CV RRR

## 2019-12-08 NOTE — Anesthesia Postprocedure Evaluation (Signed)
Anesthesia Post Note  Patient: Ryan Benjamin  Procedure(s) Performed: CYSTOSCOPY/URETEROSCOPY/HOLMIUM LASER/STENT PLACEMENT (Left )  Patient location during evaluation: PACU Anesthesia Type: General Level of consciousness: awake and alert and oriented Pain management: pain level controlled Vital Signs Assessment: post-procedure vital signs reviewed and stable Respiratory status: spontaneous breathing, nonlabored ventilation and respiratory function stable Cardiovascular status: blood pressure returned to baseline and stable Postop Assessment: no signs of nausea or vomiting Anesthetic complications: no     Last Vitals:  Vitals:   12/08/19 0855 12/08/19 0905  BP: 115/83 115/80  Pulse: 85 84  Resp: (!) 22 13  Temp:  (!) 36.3 C  SpO2: 97% 98%    Last Pain:  Vitals:   12/08/19 0905  TempSrc:   PainSc: 0-No pain                 Dequandre Cordova

## 2019-12-08 NOTE — Anesthesia Procedure Notes (Signed)
Procedure Name: LMA Insertion Date/Time: 12/08/2019 7:32 AM Performed by: Rosanne Gutting, CRNA Pre-anesthesia Checklist: Patient identified, Patient being monitored, Timeout performed, Emergency Drugs available and Suction available Patient Re-evaluated:Patient Re-evaluated prior to induction Oxygen Delivery Method: Circle system utilized Preoxygenation: Pre-oxygenation with 100% oxygen Induction Type: IV induction Ventilation: Mask ventilation without difficulty LMA: LMA inserted LMA Size: 4.0 Tube type: Oral Number of attempts: 1 Placement Confirmation: positive ETCO2 and breath sounds checked- equal and bilateral Tube secured with: Tape Dental Injury: Teeth and Oropharynx as per pre-operative assessment

## 2019-12-08 NOTE — H&P (Signed)
12/07/2018 7:18 AM   Ryan Benjamin 1946/02/25 [id number]  Referring provider: No referring provider defined for this encounter.   HPI: 74 y.o. male presented to the ED 10/15/2019 with fever to 103 degrees and chills.  He is found to have UTI with an obstructing 4 mm left distal ureteral calculus and underwent urgent stenting by Dr. Richardo Hanks.  He was scheduled for ureteroscopic removal in late November however contracted Covid.  He presents today for ureteroscopic removal.   PMH: Past Medical History:  Diagnosis Date  . Arthritis   . Dyspnea   . Environmental and seasonal allergies   . GERD (gastroesophageal reflux disease)   . Hyperlipemia   . Kidney stone   . UTI (urinary tract infection)     Surgical History: Past Surgical History:  Procedure Laterality Date  . CYSTOSCOPY WITH STENT PLACEMENT Left 10/15/2019   Procedure: CYSTOSCOPY WITH STENT PLACEMENT;  Surgeon: Sondra Come, MD;  Location: [facility] ORS;  Service: Urology;  Laterality: Left;  . RECONSTRUCTION OF EYELID    . REVERSE SHOULDER ARTHROPLASTY Left 06/30/2019   Procedure: REVERSE SHOULDER ARTHROPLASTY;  Surgeon: Christena Flake, MD;  Location: [facility] OR[facility];  Service: Orthopedics;  Laterality: Left;  . URETERAL STENT PLACEMENT      Home Medications:  aspirin EC 81 MG tablet Take 81 mg by mouth daily.   Yes [provider]  atorvastatin (LIPITOR) 20 MG tablet Take 20 mg by mouth at bedtime.  10/07/18  Yes [provider]  esomeprazole (NEXIUM) 20 MG capsule Take 20 mg by mouth daily.    Yes [provider]  loratadine (CLARITIN) 10 MG tablet Take 10 mg by mouth at bedtime.   Yes [provider]  tamsulosin (FLOMAX) 0.4 MG CAPS capsule Take 1 capsule (0.4 mg total) by mouth daily. 01/02/19 01/02/20 Yes Janie Strothman, Verna Czech, MD  acetaminophen (TYLENOL) 650 MG CR tablet Take 1,300 mg by mouth daily.    [provider]  Carboxymethylcellul-Glycerin (LUBRICATING EYE DROPS OP)  Place 1 drop into both eyes daily as needed (dry eyes).    [provider]  oxymetazoline (AFRIN) 0.05 % nasal spray Place 1 spray into both nostrils 2 (two) times daily as needed for congestion.    [provider]  sildenafil (VIAGRA) 100 MG tablet Take 100 mg by mouth daily as needed for erectile dysfunction.    [provider]  traMADol (ULTRAM) 50 MG tablet Take 1 tablet (50 mg total) by mouth every 6 (six) hours as needed for moderate pain. Patient not taking: Reported on 10/15/2019 07/01/19   Anson [place], PA-C    Allergies: No Known Allergies  Family History: Family History  Problem Relation Age of Onset  . Prostate cancer Neg Hx   . Bladder Cancer Neg Hx   . Kidney cancer Neg Hx     Social History:  reports that he has never smoked. He has never used smokeless tobacco. He reports that he does not drink alcohol or use drugs.  ROS: Denies fever, chills, shortness of breath  Physical Exam: BP 125/69   Pulse 78   Temp 97.9 F (36.6 C) (Tympanic)   Resp 14   Ht 5\' 6"  (1.676 m)   Wt 90.7 kg   SpO2 98%   BMI 32.28 kg/m   Constitutional:  Alert and oriented, No acute distress. HEENT: [place] AT, moist mucus membranes.  Trachea midline, no masses. Cardiovascular: No clubbing, cyanosis, or edema. Respiratory: Normal respiratory effort, no increased work  of breathing. GI: Abdomen is soft, nontender, nondistended, no abdominal masses GU: No CVA tenderness Lymph: No cervical or inguinal lymphadenopathy. Skin: No rashes, bruises or suspicious lesions. Neurologic: Grossly intact, no focal deficits, moving all 4 extremities. Psychiatric: Normal mood and affect.   Assessment & Plan:   74 y.o. male with a 4 mm left distal ureteral calculus who presents for ureteroscopic removal.  He also has small left renal calculi and will attempt to clear his upper tract stones.  The indications and nature of the planned procedure were discussed as well  as the potential  benefits and expected outcome.  Alternatives have been discussed in detail. The most common complications and side effects were discussed including but not limited to infection/sepsis; blood loss; damage to urethra, bladder, ureter, kidney; need for multiple surgeries; need for prolonged stent placement as well as general anesthesia risks.  All of his questions were answered and he desires to proceed.     Abbie Sons, [place] [address], [place], [place] [address] [phone]

## 2019-12-08 NOTE — Anesthesia Preprocedure Evaluation (Signed)
Anesthesia Evaluation  Patient identified by MRN, date of birth, ID band Patient awake    Reviewed: Allergy & Precautions, NPO status , Patient's Chart, lab work & pertinent test results  History of Anesthesia Complications Negative for: history of anesthetic complications  Airway Mallampati: II  TM Distance: <3 FB Neck ROM: Full    Dental no notable dental hx.    Pulmonary neg sleep apnea, neg COPD,    breath sounds clear to auscultation- rhonchi (-) wheezing      Cardiovascular Exercise Tolerance: Good (-) hypertension(-) CAD, (-) Past MI, (-) Cardiac Stents and (-) CABG  Rhythm:Regular Rate:Normal - Systolic murmurs and - Diastolic murmurs    Neuro/Psych neg Seizures negative neurological ROS  negative psych ROS   GI/Hepatic Neg liver ROS, GERD  ,  Endo/Other  negative endocrine ROSneg diabetes  Renal/GU Renal disease: nephrolithiasis.     Musculoskeletal  (+) Arthritis ,   Abdominal (+) + obese,   Peds  Hematology negative hematology ROS (+)   Anesthesia Other Findings Past Medical History: No date: Arthritis No date: Dyspnea No date: Environmental and seasonal allergies No date: GERD (gastroesophageal reflux disease) No date: Hyperlipemia No date: Kidney stone No date: UTI (urinary tract infection)   Reproductive/Obstetrics                             Anesthesia Physical Anesthesia Plan  ASA: II  Anesthesia Plan: General   Post-op Pain Management:    Induction: Intravenous  PONV Risk Score and Plan: 1 and Ondansetron and Dexamethasone  Airway Management Planned: LMA  Additional Equipment:   Intra-op Plan:   Post-operative Plan:   Informed Consent: I have reviewed the patients History and Physical, chart, labs and discussed the procedure including the risks, benefits and alternatives for the proposed anesthesia with the patient or authorized representative who has  indicated his/her understanding and acceptance.     Dental advisory given  Plan Discussed with: CRNA and Anesthesiologist  Anesthesia Plan Comments:         Anesthesia Quick Evaluation

## 2019-12-08 NOTE — Transfer of Care (Signed)
Immediate Anesthesia Transfer of Care Note  Patient: Ryan Benjamin  Procedure(s) Performed: CYSTOSCOPY/URETEROSCOPY/HOLMIUM LASER/STENT PLACEMENT (Left )  Patient Location: PACU  Anesthesia Type:General  Level of Consciousness: sedated  Airway & Oxygen Therapy: Patient Spontanous Breathing and Patient connected to face mask oxygen  Post-op Assessment: Report given to RN and Post -op Vital signs reviewed and stable  Post vital signs: Reviewed and stable  Last Vitals:  Vitals Value Taken Time  BP 110/78 12/08/19 0825  Temp    Pulse 86 12/08/19 0826  Resp 17 12/08/19 0826  SpO2 99 % 12/08/19 0826  Vitals shown include unvalidated device data.  Last Pain:  Vitals:   12/08/19 0613  TempSrc: Tympanic         Complications: No apparent anesthesia complications

## 2019-12-09 ENCOUNTER — Telehealth: Payer: Self-pay

## 2019-12-09 NOTE — Telephone Encounter (Signed)
Pt calls and states that about 4in of his stent has come out and is visible at the end of the penis. Pt denies fever, or flank pain. Spoke with Dr. Apolinar Junes who advised that pt remove stent. Gave pt verbal instructions on how to pull stent, pt gave verbal understanding. Advised pt to monitor for fever and and flank pain, advised pt go to ED if any of these symptoms develop. Pt gave verbal understanding.

## 2019-12-11 ENCOUNTER — Ambulatory Visit: Payer: Medicare Other | Admitting: Physician Assistant

## 2019-12-14 ENCOUNTER — Other Ambulatory Visit: Payer: Self-pay

## 2019-12-14 ENCOUNTER — Other Ambulatory Visit: Payer: Medicare PPO

## 2019-12-14 DIAGNOSIS — R972 Elevated prostate specific antigen [PSA]: Secondary | ICD-10-CM

## 2019-12-14 LAB — CALCULI, WITH PHOTOGRAPH (CLINICAL LAB)
Calcium Oxalate Dihydrate: 40 %
Calcium Oxalate Monohydrate: 50 %
Hydroxyapatite: 10 %
Weight Calculi: 33 mg

## 2019-12-15 LAB — PSA: Prostate Specific Ag, Serum: 7.5 ng/mL — ABNORMAL HIGH (ref 0.0–4.0)

## 2019-12-24 ENCOUNTER — Other Ambulatory Visit: Payer: Self-pay

## 2019-12-24 ENCOUNTER — Encounter: Payer: Self-pay | Admitting: Urology

## 2019-12-24 ENCOUNTER — Ambulatory Visit: Payer: Medicare PPO | Admitting: Urology

## 2019-12-24 VITALS — BP 131/86 | HR 86 | Ht 67.0 in | Wt 200.0 lb

## 2019-12-24 DIAGNOSIS — N200 Calculus of kidney: Secondary | ICD-10-CM

## 2019-12-24 DIAGNOSIS — R972 Elevated prostate specific antigen [PSA]: Secondary | ICD-10-CM | POA: Diagnosis not present

## 2019-12-24 NOTE — Progress Notes (Signed)
12/24/2019 12:37 PM   Ryan Benjamin 1946/08/05 [id number]  Referring provider: Marguarite Arbour, MD [address] [facility] [place],  [place] [address]  Chief Complaint  Patient presents with  . Follow-up    Urologic history: 1.  Elevated PSA -Fluctuating PSA; has elected surveillance -Prostate MRI 6/20; 88 cc volume; no indeterminate or high-grade lesions identified  2.  Recurrent stone disease -Urgent stenting 11/24 5 mm distal calculus with sepsis -CT with small, bilateral, nonobstructing renal calculi -Left ureteroscopic stone removal 12/08/2019 -Uretero-pyeloscopy with a 3 mm upper and 2 mm lower calyceal calculi which were removed.  -Remaining calculi intraparenchymal -Stone analysis 50% calcium oxalate mono/40% calcium oxalate di/10% hydroxyapatite   HPI: 74 y.o. male presents for follow-up.  He had no postoperative problems related to his recent ureteroscopy.  His stent was left to a tether and on the first day postop the stent was visible at the meatus and he was advised to remove which he did and had no problems after removal.  He has no complaints today.  A PSA performed 1/11 was 7.5  PSA trend as follows: 08/2014             3.16 09/2015           3.87 09/2016           3.17 09/2017           4.94 12/2017             3.84 03/2018             3.12 09/2018           4.93 11/2018           24.76 01/2019             6.7 03/2019             5.8 12/2019  7.5   PMH: Past Medical History:  Diagnosis Date  . Arthritis   . Dyspnea   . Environmental and seasonal allergies   . GERD (gastroesophageal reflux disease)   . Hyperlipemia   . Kidney stone   . UTI (urinary tract infection)     Surgical History: Past Surgical History:  Procedure Laterality Date  . CYSTOSCOPY WITH STENT PLACEMENT Left 10/15/2019   Procedure: CYSTOSCOPY WITH STENT PLACEMENT;  Surgeon: Ryan Come, MD;  Location: [facility];  Service: Urology;  Laterality: Left;  .  CYSTOSCOPY/URETEROSCOPY/HOLMIUM LASER/STENT PLACEMENT Left 12/08/2019   Procedure: CYSTOSCOPY/URETEROSCOPY/HOLMIUM LASER/STENT PLACEMENT;  Surgeon: Ryan Altes, MD;  Location: [facility];  Service: Urology;  Laterality: Left;  . RECONSTRUCTION OF EYELID    . REVERSE SHOULDER ARTHROPLASTY Left 06/30/2019   Procedure: REVERSE SHOULDER ARTHROPLASTY;  Surgeon: Ryan Flake, MD;  Location: [facility];  Service: Orthopedics;  Laterality: Left;  . URETERAL STENT PLACEMENT      Home Medications:  Allergies as of 12/24/2019   No Known Allergies     Medication List       Accurate as of December 24, 2019 12:37 PM. If you have any questions, ask your nurse or doctor.        STOP taking these medications   Uribel 118 MG Caps Stopped by: Ryan Altes, MD     TAKE these medications   acetaminophen 325 MG tablet Commonly known as: TYLENOL Take 650 mg by mouth every 6 (six) hours as needed.   aspirin EC 81 MG tablet Take 81 mg by mouth daily.  atorvastatin 20 MG tablet Commonly known as: LIPITOR Take 20 mg by mouth at bedtime.   esomeprazole 20 MG capsule Commonly known as: NEXIUM Take 20 mg by mouth daily.   fluticasone 50 MCG/ACT nasal spray Commonly known as: FLONASE Place 2 sprays into both nostrils daily.   loratadine 10 MG tablet Commonly known as: CLARITIN Take 10 mg by mouth at bedtime.   LUBRICATING EYE DROPS OP Place 1 drop into both eyes daily as needed (dry eyes).   oxymetazoline 0.05 % nasal spray Commonly known as: AFRIN Place 1 spray into both nostrils 2 (two) times daily as needed for congestion.   tamsulosin 0.4 MG Caps capsule Commonly known as: FLOMAX Take 0.4 mg by mouth.   Viagra 100 MG tablet Generic drug: sildenafil Take 100 mg by mouth daily as needed for erectile dysfunction.       Allergies: No Known Allergies  Family History: Family History  Problem Relation Age of Onset  . Prostate cancer Neg Hx   . Bladder Cancer Neg Hx   . Kidney  cancer Neg Hx     Social History:  reports that he has never smoked. He has never used smokeless tobacco. He reports that he does not drink alcohol or use drugs.  ROS: UROLOGY Frequent Urination?: No Hard to postpone urination?: No Burning/pain with urination?: No Get up at night to urinate?: No Leakage of urine?: No Urine stream starts and stops?: No Trouble starting stream?: No Do you have to strain to urinate?: No Blood in urine?: No Urinary tract infection?: No Sexually transmitted disease?: No Injury to kidneys or bladder?: No Painful intercourse?: No Weak stream?: No Erection problems?: No Penile pain?: No  Gastrointestinal Nausea?: No Vomiting?: No Indigestion/heartburn?: No Diarrhea?: No Constipation?: No  Constitutional Fever: No Night sweats?: No Weight loss?: No Fatigue?: No  Skin Skin rash/lesions?: No Itching?: No  Eyes Blurred vision?: No Double vision?: No  Ears/Nose/Throat Sore throat?: No Sinus problems?: No  Hematologic/Lymphatic Swollen glands?: No Easy bruising?: No  Cardiovascular Leg swelling?: No Chest pain?: No  Respiratory Cough?: No Shortness of breath?: No  Endocrine Excessive thirst?: No  Musculoskeletal Back pain?: No Joint pain?: No  Neurological Headaches?: No Dizziness?: No  Psychologic Depression?: No Anxiety?: No  Physical Exam: BP 131/86   Pulse 86   Ht 5\' 7"  (1.702 m)   Wt 200 lb (90.7 kg)   BMI 31.32 kg/m   Constitutional:  Alert and oriented, No acute distress. HEENT: [place] AT, moist mucus membranes.  Trachea midline, no masses. Cardiovascular: No clubbing, cyanosis, or edema. Respiratory: Normal respiratory effort, no increased work of breathing.. Skin: No rashes, bruises or suspicious lesions. Neurologic: Grossly intact, no focal deficits, moving all 4 extremities. Psychiatric: Normal mood and affect.   Assessment & Plan:    - Nephrolithiasis Doing well status post ureteroscopic removal  of a left distal calculus.  He has bilateral renal calculi and will obtain a follow-up visit/KUB in 6 months.  I did not discuss a metabolic evaluation at today's office visit and will send Ryan Benjamin a MyChart message.  - Elevated PSA Most recent PSA slightly elevated above baseline however was drawn 1 week after ureteroscopic stone removal.  Recent MRI without suspicious lesions.  Will continue surveillance and follow-up lab visit 3 months for a repeat PSA.  Check UA at that time.   Abbie Sons, [place] [address], [place],  [address] [phone]

## 2020-01-18 ENCOUNTER — Telehealth: Payer: Self-pay | Admitting: Urology

## 2020-01-18 ENCOUNTER — Other Ambulatory Visit: Payer: Self-pay | Admitting: *Deleted

## 2020-01-18 MED ORDER — TAMSULOSIN HCL 0.4 MG PO CAPS: 0.4000 mg | ORAL_CAPSULE | Freq: Every day | ORAL | 0 refills | Status: AC

## 2020-01-18 NOTE — Telephone Encounter (Signed)
rx sent

## 2020-01-18 NOTE — Telephone Encounter (Signed)
Pt LMOM and would like a refill for Tamsulosin 90 day supply sent to [facility].

## 2020-03-24 ENCOUNTER — Other Ambulatory Visit: Payer: Self-pay

## 2020-03-24 ENCOUNTER — Other Ambulatory Visit: Payer: Medicare PPO

## 2020-03-24 DIAGNOSIS — R972 Elevated prostate specific antigen [PSA]: Secondary | ICD-10-CM

## 2020-03-24 NOTE — Progress Notes (Signed)
PSA

## 2020-03-25 LAB — PSA: Prostate Specific Ag, Serum: 4.3 ng/mL — ABNORMAL HIGH (ref 0.0–4.0)

## 2020-03-25 LAB — URINALYSIS, COMPLETE
Bilirubin, UA: NEGATIVE
Glucose, UA: NEGATIVE
Leukocytes,UA: NEGATIVE
Nitrite, UA: NEGATIVE
Specific Gravity, UA: >1.03 — ABNORMAL HIGH (ref 1.005–1.030)
Urobilinogen, Ur: 0.2 mg/dL (ref 0.2–1.0)
pH, UA: 5.5 (ref 5.0–7.5)

## 2020-03-25 LAB — MICROSCOPIC EXAMINATION
Bacteria, UA: NONE SEEN
RBC, Urine: NONE SEEN /hpf (ref 0–2)

## 2020-03-28 ENCOUNTER — Telehealth: Payer: Self-pay | Admitting: *Deleted

## 2020-03-28 NOTE — Telephone Encounter (Signed)
Notified patient as instructed, patient pleased. Discussed follow-up appointments, patient agrees  

## 2020-03-28 NOTE — Telephone Encounter (Signed)
-----   Message from Riki Altes, MD sent at 03/27/2020  9:16 AM EDT ----- Follow-up PSA much better at 4.3.  Keep scheduled follow-up

## 2020-06-08 ENCOUNTER — Other Ambulatory Visit: Payer: Self-pay | Admitting: *Deleted

## 2020-06-08 DIAGNOSIS — N200 Calculus of kidney: Secondary | ICD-10-CM

## 2020-06-22 ENCOUNTER — Other Ambulatory Visit: Payer: Self-pay

## 2020-06-22 ENCOUNTER — Ambulatory Visit
Admission: RE | Admit: 2020-06-22 | Discharge: 2020-06-22 | Disposition: A | Discharge status: Home / Self Care | Payer: Medicare PPO | Attending: Urology | Admitting: Urology

## 2020-06-22 ENCOUNTER — Ambulatory Visit: Payer: Medicare PPO | Admitting: Urology

## 2020-06-22 ENCOUNTER — Encounter: Payer: Self-pay | Admitting: Urology

## 2020-06-22 ENCOUNTER — Ambulatory Visit
Admission: RE | Admit: 2020-06-22 | Discharge: 2020-06-22 | Disposition: A | Discharge status: Home / Self Care | Payer: Medicare PPO | Source: Ambulatory Visit | Attending: Urology | Admitting: Urology

## 2020-06-22 VITALS — BP 133/80 | HR 78 | Ht 67.0 in | Wt 210.0 lb

## 2020-06-22 DIAGNOSIS — N401 Enlarged prostate with lower urinary tract symptoms: Secondary | ICD-10-CM | POA: Diagnosis not present

## 2020-06-22 DIAGNOSIS — N200 Calculus of kidney: Secondary | ICD-10-CM | POA: Diagnosis not present

## 2020-06-22 DIAGNOSIS — R972 Elevated prostate specific antigen [PSA]: Secondary | ICD-10-CM | POA: Diagnosis not present

## 2020-06-22 NOTE — Progress Notes (Signed)
06/22/2020 2:54 PM   Ryan Benjamin 1946/08/20 [id number]  Referring provider: Marguarite Arbour, MD [address] [facility] [place],  [place] [address]  Chief Complaint  Patient presents with  . Nephrolithiasis    Urologic history: 1.  Elevated PSA -Fluctuating PSA; has elected surveillance -Prostate MRI 6/20; 88 cc volume; no indeterminate or high-grade lesions identified  2.  Recurrent stone disease -Urgent stenting 11/20 for 5 mm distal calculus with sepsis -CT with small, bilateral, nonobstructing renal calculi -Left ureteroscopic stone removal 12/08/2019 -Uretero-pyeloscopy with a 3 mm upper and 2 mm lower calyceal calculi which were removed.   -Remaining calculi intraparenchymal -Stone analysis 50% calcium oxalate mono/40% calcium oxalate di/10% hydroxyapatite  3.  BPH with LUTS -Tamsulosin 0.4 mg daily   HPI: 74 y.o. male presents for follow-up.   Doing well, no complaints  After last visit I had sent him a my chart message regarding a metabolic evaluation and he wanted to discuss this further  KUB today with faint calcification overlying left renal outline, significant stool/bowel gas overlying renal outlines making radiograph suboptimal   PMH: Past Medical History:  Diagnosis Date  . Arthritis   . Dyspnea   . Environmental and seasonal allergies   . GERD (gastroesophageal reflux disease)   . Hyperlipemia   . Kidney stone   . UTI (urinary tract infection)     Surgical History: Past Surgical History:  Procedure Laterality Date  . CYSTOSCOPY WITH STENT PLACEMENT Left 10/15/2019   Procedure: CYSTOSCOPY WITH STENT PLACEMENT;  Surgeon: Sondra Come, MD;  Location: [facility];  Service: Urology;  Laterality: Left;  . CYSTOSCOPY/URETEROSCOPY/HOLMIUM LASER/STENT PLACEMENT Left 12/08/2019   Procedure: CYSTOSCOPY/URETEROSCOPY/HOLMIUM LASER/STENT PLACEMENT;  Surgeon: Riki Altes, MD;  Location: [facility];  Service: Urology;  Laterality:  Left;  . RECONSTRUCTION OF EYELID    . REVERSE SHOULDER ARTHROPLASTY Left 06/30/2019   Procedure: REVERSE SHOULDER ARTHROPLASTY;  Surgeon: Christena Flake, MD;  Location: [facility];  Service: Orthopedics;  Laterality: Left;  . URETERAL STENT PLACEMENT      Home Medications:  Allergies as of 06/22/2020   No Known Allergies     Medication List       Accurate as of June 22, 2020  2:54 PM. If you have any questions, ask your nurse or doctor.        acetaminophen 325 MG tablet Commonly known as: TYLENOL Take 650 mg by mouth every 6 (six) hours as needed.   aspirin EC 81 MG tablet Take 81 mg by mouth daily.   atorvastatin 20 MG tablet Commonly known as: LIPITOR Take 20 mg by mouth at bedtime.   esomeprazole 20 MG capsule Commonly known as: NEXIUM Take 20 mg by mouth daily.   fluticasone 50 MCG/ACT nasal spray Commonly known as: FLONASE Place 2 sprays into both nostrils daily.   loratadine 10 MG tablet Commonly known as: CLARITIN Take 10 mg by mouth at bedtime.   LUBRICATING EYE DROPS OP Place 1 drop into both eyes daily as needed (dry eyes).   oxymetazoline 0.05 % nasal spray Commonly known as: AFRIN Place 1 spray into both nostrils 2 (two) times daily as needed for congestion.   tamsulosin 0.4 MG Caps capsule Commonly known as: FLOMAX Take 1 capsule (0.4 mg total) by mouth daily.   Viagra 100 MG tablet Generic drug: sildenafil Take 100 mg by mouth daily as needed for erectile dysfunction.       Allergies: No Known Allergies  Family  History: Family History  Problem Relation Age of Onset  . Prostate cancer Neg Hx   . Bladder Cancer Neg Hx   . Kidney cancer Neg Hx     Social History:  reports that he has never smoked. He has never used smokeless tobacco. He reports that he does not drink alcohol and does not use drugs.   Physical Exam: BP 133/80   Pulse 78   Ht 5\' 7"  (1.702 m)   Wt 210 lb (95.3 kg)   BMI 32.89 kg/m   Constitutional:  Alert and  oriented, No acute distress. HEENT: [place] AT, moist mucus membranes.  Trachea midline, no masses. Cardiovascular: No clubbing, cyanosis, or edema. Respiratory: Normal respiratory effort, no increased work of breathing. Skin: No rashes, bruises or suspicious lesions. Neurologic: Grossly intact, no focal deficits, moving all 4 extremities. Psychiatric: Normal mood and affect.   Assessment & Plan:    1. Nephrolithiasis  We discussed the rationale for a metabolic evaluation and he would like to proceed  Follow-up 1 year with KUB  2.  Elevated PSA  Last PSA 03/2020 stable at 4.3  Recommend follow-up PSA 1 year  3.  BPH with LUTS  Stable on tamsulosin   04/2020, MD  [facility] [address], [address] [place], [place] [id number]

## 2020-06-22 NOTE — Patient Instructions (Signed)

## 2020-06-23 ENCOUNTER — Ambulatory Visit: Payer: Self-pay | Admitting: Urology

## 2020-06-24 ENCOUNTER — Telehealth: Payer: Self-pay | Admitting: *Deleted

## 2020-06-24 NOTE — Telephone Encounter (Signed)
-----   Message from Riki Altes, MD sent at 06/24/2020  7:48 AM EDT ----- KUB showed small, bilateral renal calculi.  Proceed with 24 urine study as we discussed

## 2020-06-24 NOTE — Telephone Encounter (Signed)
Left message on vm per DPR °

## 2020-06-28 ENCOUNTER — Telehealth: Payer: Self-pay

## 2020-06-28 NOTE — Telephone Encounter (Signed)
Pt calls and states that there was a voicemail from [place] yesterday but he was unable to understand the information left. Reiterated KUB results to patient. Pt gave verbal understanding.

## 2020-07-06 ENCOUNTER — Other Ambulatory Visit: Payer: Medicare PPO

## 2020-07-06 ENCOUNTER — Other Ambulatory Visit: Payer: Self-pay

## 2020-07-20 ENCOUNTER — Other Ambulatory Visit: Payer: Self-pay | Admitting: Urology

## 2020-10-03 ENCOUNTER — Encounter: Payer: Self-pay | Admitting: Urology

## 2021-06-22 ENCOUNTER — Ambulatory Visit: Payer: Medicare PPO | Admitting: Urology

## 2021-06-22 ENCOUNTER — Ambulatory Visit
Admission: RE | Admit: 2021-06-22 | Discharge: 2021-06-22 | Disposition: A | Discharge status: Home / Self Care | Payer: Medicare PPO | Attending: Urology | Admitting: Urology

## 2021-06-22 ENCOUNTER — Other Ambulatory Visit: Payer: Self-pay

## 2021-06-22 ENCOUNTER — Encounter: Payer: Self-pay | Admitting: Urology

## 2021-06-22 ENCOUNTER — Ambulatory Visit
Admission: RE | Admit: 2021-06-22 | Discharge: 2021-06-22 | Disposition: A | Discharge status: Home / Self Care | Payer: Medicare PPO | Source: Ambulatory Visit | Attending: Urology | Admitting: Urology

## 2021-06-22 VITALS — BP 147/87 | HR 80 | Ht 67.0 in | Wt 210.0 lb

## 2021-06-22 DIAGNOSIS — N401 Enlarged prostate with lower urinary tract symptoms: Secondary | ICD-10-CM

## 2021-06-22 DIAGNOSIS — R972 Elevated prostate specific antigen [PSA]: Secondary | ICD-10-CM

## 2021-06-22 DIAGNOSIS — N200 Calculus of kidney: Secondary | ICD-10-CM | POA: Insufficient documentation

## 2021-06-22 NOTE — Progress Notes (Signed)
06/22/2021 11:14 AM   Ryan Benjamin 06-06-1946 [id number]  Referring provider: Marguarite Arbour, MD [address] [facility] [place],  [place] [address]  Chief Complaint  Patient presents with   Nephrolithiasis    Urologic history: 1.  Elevated PSA -Fluctuating PSA; has elected surveillance -Prostate MRI 6/20; 88 cc volume; no indeterminate or high-grade lesions identified   2.  Recurrent stone disease -Urgent stenting 11/20 for 5 mm distal calculus with sepsis -CT with small, bilateral, nonobstructing renal calculi -Left ureteroscopic stone removal 12/08/2019 -Uretero-pyeloscopy with a 3 mm upper and 2 mm lower calyceal calculi which were removed.   -Remaining calculi intraparenchymal -Stone analysis 50% calcium oxalate mono/40% calcium oxalate di/10% hydroxyapatite   3.  BPH with LUTS -Tamsulosin 0.4 mg daily   HPI: 75 y.o. male presents for annual follow-up.  No urologic problems since last years visit Doing well on tamsulosin, notes occasional urgency Denies flank, abdominal or pelvic pain No dysuria or gross hematuria 24-hour urine study with low urine volume at <1 L PSA 06/22/2021 stable at 4.1   PMH: Past Medical History:  Diagnosis Date   Arthritis    Dyspnea    Environmental and seasonal allergies    GERD (gastroesophageal reflux disease)    Hyperlipemia    Kidney stone    UTI (urinary tract infection)     Surgical History: Past Surgical History:  Procedure Laterality Date   CYSTOSCOPY WITH STENT PLACEMENT Left 10/15/2019   Procedure: CYSTOSCOPY WITH STENT PLACEMENT;  Surgeon: Sondra Come, MD;  Location: [facility];  Service: Urology;  Laterality: Left;   CYSTOSCOPY/URETEROSCOPY/HOLMIUM LASER/STENT PLACEMENT Left 12/08/2019   Procedure: CYSTOSCOPY/URETEROSCOPY/HOLMIUM LASER/STENT PLACEMENT;  Surgeon: Riki Altes, MD;  Location: [facility];  Service: Urology;  Laterality: Left;   RECONSTRUCTION OF EYELID     REVERSE SHOULDER  ARTHROPLASTY Left 06/30/2019   Procedure: REVERSE SHOULDER ARTHROPLASTY;  Surgeon: Christena Flake, MD;  Location: [facility];  Service: Orthopedics;  Laterality: Left;   URETERAL STENT PLACEMENT      Home Medications:  Allergies as of 06/22/2021   No Known Allergies      Medication List        Accurate as of June 22, 2021 11:14 AM. If you have any questions, ask your nurse or doctor.          acetaminophen 325 MG tablet Commonly known as: TYLENOL Take 650 mg by mouth every 6 (six) hours as needed.   aspirin EC 81 MG tablet Take 81 mg by mouth daily.   atorvastatin 20 MG tablet Commonly known as: LIPITOR Take 20 mg by mouth at bedtime.   esomeprazole 20 MG capsule Commonly known as: NEXIUM Take 20 mg by mouth daily.   fluticasone 50 MCG/ACT nasal spray Commonly known as: FLONASE Place 2 sprays into both nostrils daily.   loratadine 10 MG tablet Commonly known as: CLARITIN Take 10 mg by mouth at bedtime.   LUBRICATING EYE DROPS OP Place 1 drop into both eyes daily as needed (dry eyes).   oxymetazoline 0.05 % nasal spray Commonly known as: AFRIN Place 1 spray into both nostrils 2 (two) times daily as needed for congestion.   sildenafil 100 MG tablet Commonly known as: VIAGRA Take 100 mg by mouth daily as needed for erectile dysfunction.   tamsulosin 0.4 MG Caps capsule Commonly known as: FLOMAX Take 1 capsule (0.4 mg total) by mouth daily.        Allergies: No Known Allergies  Family History: Family History  Problem Relation Age of Onset   Prostate cancer Neg Hx    Bladder Cancer Neg Hx    Kidney cancer Neg Hx     Social History:  reports that he has never smoked. He has never used smokeless tobacco. He reports that he does not drink alcohol and does not use drugs.   Physical Exam: BP (!) 147/87   Pulse 80   Ht 5\' 7"  (1.702 m)   Wt 210 lb (95.3 kg)   BMI 32.89 kg/m   Constitutional:  Alert and oriented, No acute distress. HEENT: [place] AT, moist  mucus membranes.  Trachea midline, no masses. Cardiovascular: No clubbing, cyanosis, or edema. Respiratory: Normal respiratory effort, no increased work of breathing.   Laboratory Data:  Urinalysis Dipstick/microscopy negative  Pertinent Imaging: KUB images reviewed and personally interpreted  Abdomen 1 view (KUB)  Narrative CLINICAL DATA:  Nephrolithiasis.  EXAM: ABDOMEN - 1 VIEW  COMPARISON:  Apr 30, 2019.  FINDINGS: The bowel gas pattern is normal. Bilateral nephrolithiasis is again noted.  IMPRESSION: Bilateral nephrolithiasis. No evidence of bowel obstruction or ileus.   Electronically Signed By: May 02, 2019 M.D. On: 06/23/2020 08:54   Assessment & Plan:    1.  BPH with LUTS Stable on tamsulosin  2.  Nephrolithiasis Stable bilateral nonobstructing renal calculi 1 year follow-up with KUB We again discussed increasing his water intake to keep urine output between 2-2.5 L/day  3.  Elevated PSA Stable   06/25/2020, MD  [facility] [address], [address] [place], [place] [id number]

## 2021-06-23 ENCOUNTER — Encounter: Payer: Self-pay | Admitting: *Deleted

## 2021-06-23 LAB — MICROSCOPIC EXAMINATION
Bacteria, UA: NONE SEEN
Epithelial Cells (non renal): NONE SEEN /hpf (ref 0–10)

## 2021-06-23 LAB — URINALYSIS, COMPLETE
Bilirubin, UA: NEGATIVE
Glucose, UA: NEGATIVE
Ketones, UA: NEGATIVE
Leukocytes,UA: NEGATIVE
Nitrite, UA: NEGATIVE
Protein,UA: NEGATIVE
Specific Gravity, UA: 1.025 (ref 1.005–1.030)
Urobilinogen, Ur: 0.2 mg/dL (ref 0.2–1.0)
pH, UA: 5.5 (ref 5.0–7.5)

## 2021-06-23 LAB — PSA: Prostate Specific Ag, Serum: 4.1 ng/mL — ABNORMAL HIGH (ref 0.0–4.0)

## 2021-06-24 ENCOUNTER — Encounter: Payer: Self-pay | Admitting: Urology

## 2022-06-25 ENCOUNTER — Ambulatory Visit: Payer: Self-pay | Admitting: Urology

## 2022-06-27 ENCOUNTER — Other Ambulatory Visit: Payer: Self-pay | Admitting: *Deleted

## 2022-06-27 DIAGNOSIS — N200 Calculus of kidney: Secondary | ICD-10-CM

## 2022-06-28 ENCOUNTER — Ambulatory Visit
Admission: RE | Admit: 2022-06-28 | Discharge: 2022-06-28 | Disposition: A | Discharge status: Home / Self Care | Payer: Medicare PPO | Source: Ambulatory Visit | Attending: Urology | Admitting: Urology

## 2022-06-28 ENCOUNTER — Ambulatory Visit
Admission: RE | Admit: 2022-06-28 | Discharge: 2022-06-28 | Disposition: A | Discharge status: Home / Self Care | Payer: Medicare PPO | Attending: Urology | Admitting: Urology

## 2022-06-28 ENCOUNTER — Ambulatory Visit: Payer: Medicare PPO | Admitting: Urology

## 2022-06-28 ENCOUNTER — Encounter: Payer: Self-pay | Admitting: Urology

## 2022-06-28 VITALS — BP 113/77 | HR 88 | Ht 67.0 in | Wt 210.0 lb

## 2022-06-28 DIAGNOSIS — R972 Elevated prostate specific antigen [PSA]: Secondary | ICD-10-CM | POA: Diagnosis not present

## 2022-06-28 DIAGNOSIS — N200 Calculus of kidney: Secondary | ICD-10-CM | POA: Insufficient documentation

## 2022-06-28 DIAGNOSIS — N401 Enlarged prostate with lower urinary tract symptoms: Secondary | ICD-10-CM

## 2022-06-28 NOTE — Progress Notes (Signed)
06/28/2022 12:56 PM   Elliot Gurney 1946-01-19 [id number]  Referring provider: Marguarite Arbour, MD [address] [facility] [place],  [place] [address]  Chief Complaint  Patient presents with   Nephrolithiasis    Urologic history: 1.  Elevated PSA -Fluctuating PSA; has elected surveillance -Prostate MRI 6/20; 88 cc volume; no indeterminate or high-grade lesions identified   2.  Recurrent stone disease -Urgent stenting 11/20 for 5 mm distal calculus with sepsis -CT with small, bilateral, nonobstructing renal calculi -Left ureteroscopic stone removal 12/08/2019 -Uretero-pyeloscopy with a 3 mm upper and 2 mm lower calyceal calculi which were removed.   -Remaining calculi intraparenchymal -Stone analysis 50% calcium oxalate mono/40% calcium oxalate di/10% hydroxyapatite -Metabolic evaluation remarkable for low urine volume < 1 L   3.  BPH with LUTS -Tamsulosin 0.4 mg daily   HPI: 76 y.o. male presents for annual follow-up.  No urologic problems since last years visit Doing well on tamsulosin, notes occasional urgency Denies flank, abdominal or pelvic pain No dysuria or gross hematuria Last PSA was 1 year ago   PMH: Past Medical History:  Diagnosis Date   Arthritis    Dyspnea    Environmental and seasonal allergies    GERD (gastroesophageal reflux disease)    Hyperlipemia    Kidney stone    UTI (urinary tract infection)     Surgical History: Past Surgical History:  Procedure Laterality Date   CYSTOSCOPY WITH STENT PLACEMENT Left 10/15/2019   Procedure: CYSTOSCOPY WITH STENT PLACEMENT;  Surgeon: Sondra Come, MD;  Location: [facility];  Service: Urology;  Laterality: Left;   CYSTOSCOPY/URETEROSCOPY/HOLMIUM LASER/STENT PLACEMENT Left 12/08/2019   Procedure: CYSTOSCOPY/URETEROSCOPY/HOLMIUM LASER/STENT PLACEMENT;  Surgeon: Riki Altes, MD;  Location: [facility];  Service: Urology;  Laterality: Left;   RECONSTRUCTION OF EYELID     REVERSE SHOULDER  ARTHROPLASTY Left 06/30/2019   Procedure: REVERSE SHOULDER ARTHROPLASTY;  Surgeon: Christena Flake, MD;  Location: [facility];  Service: Orthopedics;  Laterality: Left;   URETERAL STENT PLACEMENT      Home Medications:  Allergies as of 06/28/2022   No Known Allergies      Medication List        Accurate as of June 28, 2022 12:56 PM. If you have any questions, ask your nurse or doctor.          STOP taking these medications    esomeprazole 20 MG capsule Commonly known as: NEXIUM Stopped by: Riki Altes, MD   loratadine 10 MG tablet Commonly known as: CLARITIN Stopped by: Riki Altes, MD       TAKE these medications    aspirin EC 81 MG tablet Take 81 mg by mouth daily.   atorvastatin 20 MG tablet Commonly known as: LIPITOR Take 20 mg by mouth at bedtime.   diclofenac 75 MG EC tablet Commonly known as: VOLTAREN Take 75 mg by mouth 2 (two) times daily.   fluticasone 50 MCG/ACT nasal spray Commonly known as: FLONASE Place 2 sprays into both nostrils daily.   levocetirizine 5 MG tablet Commonly known as: XYZAL Take 5 mg by mouth every evening.   LUBRICATING EYE DROPS OP Place 1 drop into both eyes daily as needed (dry eyes).   omeprazole 40 MG capsule Commonly known as: PRILOSEC Take 40 mg by mouth daily.   oxymetazoline 0.05 % nasal spray Commonly known as: AFRIN Place 1 spray into both nostrils 2 (two) times daily as needed for congestion.   sildenafil 100  MG tablet Commonly known as: VIAGRA Take 100 mg by mouth daily as needed for erectile dysfunction.   tamsulosin 0.4 MG Caps capsule Commonly known as: FLOMAX Take 1 capsule (0.4 mg total) by mouth daily.        Allergies: No Known Allergies  Family History: Family History  Problem Relation Age of Onset   Prostate cancer Neg Hx    Bladder Cancer Neg Hx    Kidney cancer Neg Hx     Social History:  reports that he has never smoked. He has never used smokeless tobacco. He reports  that he does not drink alcohol and does not use drugs.   Physical Exam: BP 113/77   Pulse 88   Ht 5\' 7"  (1.702 m)   Wt 210 lb (95.3 kg)   BMI 32.89 kg/m   Constitutional:  Alert and oriented, No acute distress. HEENT: [place] AT, moist mucus membranes.  Trachea midline, no masses. Cardiovascular: No clubbing, cyanosis, or edema. Respiratory: Normal respiratory effort, no increased work of breathing.   Laboratory Data:  Urinalysis Dipstick/microscopy negative  Pertinent Imaging: KUB images reviewed and personally interpreted.  Stable bilateral renal calculi   Assessment & Plan:    1.  BPH with LUTS Stable on tamsulosin.  Being refilled by PCP  2.  Nephrolithiasis Stable bilateral nonobstructing renal calculi 1 year follow-up with KUB We again discussed increasing his water intake to keep urine output between 2-2.5 L/day  3.  Elevated PSA PSA drawn today and will call with results   , MD  [facility] [address] [place], [place] [phone]

## 2022-06-29 ENCOUNTER — Encounter: Payer: Self-pay | Admitting: Urology

## 2022-06-29 LAB — PSA: Prostate Specific Ag, Serum: 4.8 ng/mL — ABNORMAL HIGH (ref 0.0–4.0)

## 2022-09-06 ENCOUNTER — Other Ambulatory Visit: Payer: Self-pay | Admitting: Student

## 2022-09-06 DIAGNOSIS — M75101 Unspecified rotator cuff tear or rupture of right shoulder, not specified as traumatic: Secondary | ICD-10-CM

## 2022-09-06 DIAGNOSIS — S46101S Unspecified injury of muscle, fascia and tendon of long head of biceps, right arm, sequela: Secondary | ICD-10-CM

## 2022-09-13 ENCOUNTER — Ambulatory Visit
Admission: RE | Admit: 2022-09-13 | Discharge: 2022-09-13 | Disposition: A | Discharge status: Home / Self Care | Payer: Medicare PPO | Source: Ambulatory Visit | Attending: Student | Admitting: Student

## 2022-09-13 DIAGNOSIS — M75101 Unspecified rotator cuff tear or rupture of right shoulder, not specified as traumatic: Secondary | ICD-10-CM | POA: Insufficient documentation

## 2022-09-13 DIAGNOSIS — S46101S Unspecified injury of muscle, fascia and tendon of long head of biceps, right arm, sequela: Secondary | ICD-10-CM | POA: Insufficient documentation

## 2022-10-22 ENCOUNTER — Other Ambulatory Visit: Payer: Self-pay | Admitting: Surgery

## 2022-11-01 ENCOUNTER — Encounter
Admission: RE | Admit: 2022-11-01 | Discharge: 2022-11-01 | Disposition: A | Discharge status: Home / Self Care | Payer: Medicare PPO | Source: Ambulatory Visit | Attending: Surgery | Admitting: Surgery

## 2022-11-01 VITALS — BP 119/83 | HR 82 | Temp 97.8°F | Resp 16 | Ht 67.0 in | Wt 210.0 lb

## 2022-11-01 DIAGNOSIS — Z01818 Encounter for other preprocedural examination: Secondary | ICD-10-CM | POA: Insufficient documentation

## 2022-11-01 DIAGNOSIS — Z01812 Encounter for preprocedural laboratory examination: Secondary | ICD-10-CM

## 2022-11-01 HISTORY — DX: Gilbert syndrome: E80.4

## 2022-11-01 HISTORY — DX: Unspecified osteoarthritis, unspecified site: M19.90

## 2022-11-01 HISTORY — DX: Unspecified rotator cuff tear or rupture of right shoulder, not specified as traumatic: M75.101

## 2022-11-01 HISTORY — DX: Rosacea, unspecified: L71.9

## 2022-11-01 HISTORY — DX: Sepsis, unspecified organism: A41.9

## 2022-11-01 HISTORY — DX: Benign prostatic hyperplasia without lower urinary tract symptoms: N40.0

## 2022-11-01 LAB — URINALYSIS, ROUTINE W REFLEX MICROSCOPIC
Bilirubin Urine: NEGATIVE
Glucose, UA: NEGATIVE mg/dL
Hgb urine dipstick: NEGATIVE
Ketones, ur: NEGATIVE mg/dL
Leukocytes,Ua: NEGATIVE
Nitrite: NEGATIVE
Protein, ur: NEGATIVE mg/dL
Specific Gravity, Urine: 1.019 (ref 1.005–1.030)
pH: 6 (ref 5.0–8.0)

## 2022-11-01 LAB — COMPREHENSIVE METABOLIC PANEL
ALT: 33 U/L (ref 0–44)
AST: 37 U/L (ref 15–41)
Albumin: 4.3 g/dL (ref 3.5–5.0)
Alkaline Phosphatase: 68 U/L (ref 38–126)
Anion gap: 7 (ref 5–15)
BUN: 13 mg/dL (ref 8–23)
CO2: 27 mmol/L (ref 22–32)
Calcium: 9.1 mg/dL (ref 8.9–10.3)
Chloride: 107 mmol/L (ref 98–111)
Creatinine, Ser: 0.95 mg/dL (ref 0.61–1.24)
GFR, Estimated: >60 mL/min (ref >60)
Glucose, Bld: 128 mg/dL — ABNORMAL HIGH (ref 70–99)
Potassium: 3.7 mmol/L (ref 3.5–5.1)
Sodium: 141 mmol/L (ref 135–145)
Total Bilirubin: 3.9 mg/dL — ABNORMAL HIGH (ref 0.3–1.2)
Total Protein: 7.3 g/dL (ref 6.5–8.1)

## 2022-11-01 LAB — CBC WITH DIFFERENTIAL/PLATELET
Abs Immature Granulocytes: 0.01 10*3/uL (ref 0.00–0.07)
Basophils Absolute: 0.1 10*3/uL (ref 0.0–0.1)
Basophils Relative: 1 %
Eosinophils Absolute: 0.2 10*3/uL (ref 0.0–0.5)
Eosinophils Relative: 4 %
HCT: 43.3 % (ref 39.0–52.0)
Hemoglobin: 14.8 g/dL (ref 13.0–17.0)
Immature Granulocytes: 0 %
Lymphocytes Relative: 28 %
Lymphs Abs: 1.5 10*3/uL (ref 0.7–4.0)
MCH: 30.6 pg (ref 26.0–34.0)
MCHC: 34.2 g/dL (ref 30.0–36.0)
MCV: 89.5 fL (ref 80.0–100.0)
Monocytes Absolute: 0.4 10*3/uL (ref 0.1–1.0)
Monocytes Relative: 8 %
Neutro Abs: 3.2 10*3/uL (ref 1.7–7.7)
Neutrophils Relative %: 59 %
Platelets: 165 10*3/uL (ref 150–400)
RBC: 4.84 MIL/uL (ref 4.22–5.81)
RDW: 13.1 % (ref 11.5–15.5)
WBC: 5.5 10*3/uL (ref 4.0–10.5)
nRBC: 0 % (ref 0.0–0.2)

## 2022-11-01 LAB — TYPE AND SCREEN
ABO/RH(D): O POS
Antibody Screen: NEGATIVE

## 2022-11-01 LAB — SURGICAL PCR SCREEN
MRSA, PCR: NEGATIVE
Staphylococcus aureus: NEGATIVE

## 2022-11-01 NOTE — Patient Instructions (Addendum)
Your procedure is scheduled on: Thursday, December 7 Report to the Registration Desk on the 1st floor of the [facility] To find out your arrival time, please call [phone] between 1PM - 3PM on: Wednesday, December 6 If your arrival time is 6:00 am, do not arrive prior to that time as the [facility] entrance doors do not open until 6:00 am.  REMEMBER: Instructions that are not followed completely may result in serious medical risk, up to and including death; or upon the discretion of your surgeon and anesthesiologist your surgery may need to be rescheduled.  Do not eat food after midnight the night before surgery.  No gum chewing, lozengers or hard candies.  You may however, drink CLEAR liquids up to 2 hours before you are scheduled to arrive for your surgery. Do not drink anything within 2 hours of your scheduled arrival time.  Clear liquids include: - water  - apple juice without pulp - gatorade (not RED colors) - black coffee or tea (Do NOT add milk or creamers to the coffee or tea) Do NOT drink anything that is not on this list.  In addition, your doctor has ordered for you to drink the provided  Ensure Pre-Surgery Clear Carbohydrate Drink  or Gatorade G2 Drinking this carbohydrate drink up to two hours before surgery helps to reduce insulin resistance and improve patient outcomes. Please complete drinking 2 hours prior to scheduled arrival time.  TAKE THESE MEDICATIONS THE MORNING OF SURGERY WITH A SIP OF WATER:  Omeprazole (Prilosec) - (take one the night before and one on the morning of surgery - helps to prevent nausea after surgery.) Tamsulosin (Flomax)  One week prior to surgery: starting November 30 Stop aspirin and Anti-inflammatories (NSAIDS) such as Advil, Aleve, Ibuprofen, Motrin, Naproxen, Naprosyn and Aspirin based products such as Excedrin, Goodys Powder, BC Powder. Stop ANY OVER THE COUNTER supplements until after surgery. You may however, continue to  take Tylenol if needed for pain up until the day of surgery.  No Alcohol for 24 hours before or after surgery.  No Smoking including e-cigarettes for 24 hours prior to surgery.  No chewable tobacco products for at least 6 hours prior to surgery.  No nicotine patches on the day of surgery.  Do not use any "recreational" drugs for at least a week prior to your surgery.  Please be advised that the combination of cocaine and anesthesia may have negative outcomes, up to and including death. If you test positive for cocaine, your surgery will be cancelled.  On the morning of surgery brush your teeth with toothpaste and water, you may rinse your mouth with mouthwash if you wish. Do not swallow any toothpaste or mouthwash.  Use CHG Soap as directed on instruction sheet.  Do not wear jewelry, make-up, hairpins, clips or nail polish.  Do not wear lotions, powders, or perfumes.   Do not shave body from the neck down 48 hours prior to surgery just in case you cut yourself which could leave a site for infection.  Also, freshly shaved skin may become irritated if using the CHG soap.  Contact lenses, hearing aids and dentures may not be worn into surgery.  Do not bring valuables to the hospital. [facility] is not responsible for any missing/lost belongings or valuables.   Total Shoulder Arthroplasty:  use Benzolyl Peroxide 5% Gel as directed on instruction sheet.  Notify your doctor if there is any change in your medical condition (cold, fever, infection).  Wear comfortable  clothing (specific to your surgery type) to the hospital.  After surgery, you can help prevent lung complications by doing breathing exercises.  Take deep breaths and cough every 1-2 hours. Your doctor may order a device called an Incentive Spirometer to help you take deep breaths.  If you are being admitted to the hospital overnight, leave your suitcase in the car. After surgery it may be brought to your room.  If you  are being discharged the day of surgery, you will not be allowed to drive home. You will need a responsible adult (18 years or older) to drive you home and stay with you that night.   If you are taking public transportation, you will need to have a responsible adult (18 years or older) with you. Please confirm with your physician that it is acceptable to use public transportation.   Please call the Pre-admissions Testing Dept. at [phone] if you have any questions about these instructions.  Surgery Visitation Policy:  Patients undergoing a surgery or procedure may have two family members or support persons with them as long as the person is not COVID-19 positive or experiencing its symptoms.   Inpatient Visitation:    Visiting hours are 7 a.m. to 8 p.m. Up to four visitors are allowed at one time in a patient room. The visitors may rotate out with other people during the day. One designated support person (adult) may remain overnight.     Preparing for Surgery with CHLORHEXIDINE GLUCONATE (CHG) Soap  Chlorhexidine Gluconate (CHG) Soap  o An antiseptic cleaner that kills germs and bonds with the skin to continue killing germs even after washing  o Used for showering the night before surgery and morning of surgery  Before surgery, you can play an important role by reducing the number of germs on your skin.  CHG (Chlorhexidine gluconate) soap is an antiseptic cleanser which kills germs and bonds with the skin to continue killing germs even after washing.  Please do not use if you have an allergy to CHG or antibacterial soaps. If your skin becomes reddened/irritated stop using the CHG.  1. Shower the NIGHT BEFORE SURGERY and the MORNING OF SURGERY with CHG soap.  2. If you choose to wash your hair, wash your hair first as usual with your normal shampoo.  3. After shampooing, rinse your hair and body thoroughly to remove the shampoo.  4. Use CHG as you would any other liquid  soap. You can apply CHG directly to the skin and wash gently with a scrungie or a clean washcloth.  5. Apply the CHG soap to your body only from the neck down. Do not use on open wounds or open sores. Avoid contact with your eyes, ears, mouth, and genitals (private parts). Wash face and genitals (private parts) with your normal soap.  6. Wash thoroughly, paying special attention to the area where your surgery will be performed.  7. Thoroughly rinse your body with warm water.  8. Do not shower/wash with your normal soap after using and rinsing off the CHG soap.  9. Pat yourself dry with a clean towel.  10. Wear clean pajamas to bed the night before surgery.  12. Place clean sheets on your bed the night of your first shower and do not sleep with pets.  13. Shower again with the CHG soap on the day of surgery prior to arriving at the hospital.  14. Do not apply any deodorants/lotions/powders.  15. Please wear clean clothes to the  hospital.  Preparing for Total Shoulder Arthroplasty  Before surgery, you can play an important role by reducing the number of germs on your skin by using the following products:  Benzoyl Peroxide Gel  o Reduces the number of germs present on the skin  o Applied twice a day to shoulder area starting two days before surgery  Chlorhexidine Gluconate (CHG) Soap  o An antiseptic cleaner that kills germs and bonds with the skin to continue killing germs even after washing  o Used for showering the night before surgery and morning of surgery  BENZOYL PEROXIDE 5% GEL  Please do not use if you have an allergy to benzoyl peroxide. If your skin becomes reddened/irritated stop using the benzoyl peroxide.  Starting two days before surgery, apply as follows:  1. Apply benzoyl peroxide in the morning and at night. Apply after taking a shower. If you are not taking a shower clean entire shoulder front, back, and side along with the armpit with a clean wet  washcloth.  2. Place a quarter-sized dollop on your shoulder and rub in thoroughly, making sure to cover the front, back, and side of your shoulder, along with the armpit.  2 days before ____ AM ____ PM 1 day before ____ AM ____ PM  3. Do this twice a day for two days. (Last application is the night before surgery, AFTER using the CHG soap).  4. Do NOT apply benzoyl peroxide gel on the day of surgery.

## 2022-11-07 MED ORDER — LACTATED RINGERS IV SOLN: INTRAVENOUS | Status: DC

## 2022-11-07 MED ORDER — CHLORHEXIDINE GLUCONATE 0.12 % MT SOLN: 15.0000 mL | Freq: Once | OROMUCOSAL | Status: AC

## 2022-11-07 MED ORDER — CEFAZOLIN SODIUM-DEXTROSE 2-4 GM/100ML-% IV SOLN
2.0000 g | INTRAVENOUS | Status: AC
  Administered 2022-11-08: 2 g via INTRAVENOUS

## 2022-11-07 MED ORDER — ORAL CARE MOUTH RINSE: 15.0000 mL | Freq: Once | OROMUCOSAL | Status: AC

## 2022-11-08 ENCOUNTER — Other Ambulatory Visit: Payer: Self-pay

## 2022-11-08 ENCOUNTER — Ambulatory Visit: Payer: Medicare PPO

## 2022-11-08 ENCOUNTER — Ambulatory Visit: Payer: Medicare PPO | Admitting: Urgent Care

## 2022-11-08 ENCOUNTER — Observation Stay: Payer: Medicare PPO

## 2022-11-08 ENCOUNTER — Encounter: Payer: Self-pay | Admitting: Surgery

## 2022-11-08 ENCOUNTER — Encounter
Admission: RE | Disposition: A | Discharge status: Home / Self Care | Payer: Self-pay | Source: Home / Self Care | Attending: Surgery

## 2022-11-08 ENCOUNTER — Observation Stay
Admission: RE | Admit: 2022-11-08 | Discharge: 2022-11-09 | Disposition: A | Discharge status: Home / Self Care | Payer: Medicare PPO | Attending: Surgery | Admitting: Surgery

## 2022-11-08 DIAGNOSIS — Z79899 Other long term (current) drug therapy: Secondary | ICD-10-CM | POA: Insufficient documentation

## 2022-11-08 DIAGNOSIS — S46101A Unspecified injury of muscle, fascia and tendon of long head of biceps, right arm, initial encounter: Secondary | ICD-10-CM | POA: Insufficient documentation

## 2022-11-08 DIAGNOSIS — M7581 Other shoulder lesions, right shoulder: Secondary | ICD-10-CM | POA: Insufficient documentation

## 2022-11-08 DIAGNOSIS — Z7982 Long term (current) use of aspirin: Secondary | ICD-10-CM | POA: Diagnosis not present

## 2022-11-08 DIAGNOSIS — M75121 Complete rotator cuff tear or rupture of right shoulder, not specified as traumatic: Secondary | ICD-10-CM | POA: Diagnosis present

## 2022-11-08 DIAGNOSIS — Z96611 Presence of right artificial shoulder joint: Secondary | ICD-10-CM

## 2022-11-08 DIAGNOSIS — Z96612 Presence of left artificial shoulder joint: Secondary | ICD-10-CM | POA: Insufficient documentation

## 2022-11-08 DIAGNOSIS — I10 Essential (primary) hypertension: Secondary | ICD-10-CM | POA: Insufficient documentation

## 2022-11-08 DIAGNOSIS — X58XXXA Exposure to other specified factors, initial encounter: Secondary | ICD-10-CM | POA: Insufficient documentation

## 2022-11-08 DIAGNOSIS — Z96652 Presence of left artificial knee joint: Secondary | ICD-10-CM | POA: Diagnosis not present

## 2022-11-08 HISTORY — PX: REVERSE SHOULDER ARTHROPLASTY: SHX5054

## 2022-11-08 SURGERY — ARTHROPLASTY, SHOULDER, TOTAL, REVERSE
Anesthesia: Regional | Site: Shoulder | Laterality: Right

## 2022-11-08 MED ORDER — ROCURONIUM BROMIDE 100 MG/10ML IV SOLN
INTRAVENOUS | Status: DC | PRN
  Administered 2022-11-08: 50 mg via INTRAVENOUS

## 2022-11-08 MED ORDER — OXYCODONE HCL 5 MG PO TABS: 5.0000 mg | ORAL_TABLET | ORAL | Status: DC | PRN

## 2022-11-08 MED ORDER — BISACODYL 10 MG RE SUPP: 10.0000 mg | Freq: Every day | RECTAL | Status: DC | PRN

## 2022-11-08 MED ORDER — ATORVASTATIN CALCIUM 20 MG PO TABS
20.0000 mg | ORAL_TABLET | Freq: Every day | ORAL | Status: DC
  Administered 2022-11-09: 20 mg via ORAL
  Filled 2022-11-08: qty 1

## 2022-11-08 MED ORDER — TRIAMCINOLONE ACETONIDE 40 MG/ML IJ SUSP
INTRAMUSCULAR | Status: AC
  Filled 2022-11-08: qty 1

## 2022-11-08 MED ORDER — BUPIVACAINE HCL (PF) 0.5 % IJ SOLN
INTRAMUSCULAR | Status: AC
  Filled 2022-11-08: qty 10

## 2022-11-08 MED ORDER — OXYMETAZOLINE HCL 0.05 % NA SOLN
1.0000 | Freq: Two times a day (BID) | NASAL | Status: DC
  Administered 2022-11-08: 1 via NASAL
  Filled 2022-11-08: qty 15

## 2022-11-08 MED ORDER — SODIUM CHLORIDE 0.9 % IV SOLN: INTRAVENOUS | Status: DC

## 2022-11-08 MED ORDER — OXYCODONE HCL 5 MG PO TABS: 5.0000 mg | ORAL_TABLET | Freq: Once | ORAL | Status: DC | PRN

## 2022-11-08 MED ORDER — SUGAMMADEX SODIUM 200 MG/2ML IV SOLN
INTRAVENOUS | Status: DC | PRN
  Administered 2022-11-08: 200 mg via INTRAVENOUS

## 2022-11-08 MED ORDER — ENOXAPARIN SODIUM 40 MG/0.4ML IJ SOSY
40.0000 mg | PREFILLED_SYRINGE | INTRAMUSCULAR | Status: DC
  Administered 2022-11-09: 40 mg via SUBCUTANEOUS
  Filled 2022-11-08: qty 0.4

## 2022-11-08 MED ORDER — PROPOFOL 10 MG/ML IV BOLUS
INTRAVENOUS | Status: DC | PRN
  Administered 2022-11-08: 150 mg via INTRAVENOUS

## 2022-11-08 MED ORDER — TRANEXAMIC ACID 1000 MG/10ML IV SOLN
INTRAVENOUS | Status: AC
  Filled 2022-11-08: qty 10

## 2022-11-08 MED ORDER — ACETAMINOPHEN 500 MG PO TABS
1000.0000 mg | ORAL_TABLET | Freq: Four times a day (QID) | ORAL | Status: AC
  Administered 2022-11-08 – 2022-11-09 (×3): 1000 mg via ORAL
  Filled 2022-11-08 (×4): qty 2

## 2022-11-08 MED ORDER — EPHEDRINE SULFATE (PRESSORS) 50 MG/ML IJ SOLN
INTRAMUSCULAR | Status: DC | PRN
  Administered 2022-11-08 (×2): 5 mg via INTRAVENOUS
  Administered 2022-11-08: 10 mg via INTRAVENOUS

## 2022-11-08 MED ORDER — ACETAMINOPHEN 10 MG/ML IV SOLN: 1000.0000 mg | Freq: Once | INTRAVENOUS | Status: DC | PRN

## 2022-11-08 MED ORDER — FENTANYL CITRATE (PF) 100 MCG/2ML IJ SOLN
INTRAMUSCULAR | Status: AC
  Filled 2022-11-08: qty 2

## 2022-11-08 MED ORDER — CEFAZOLIN SODIUM-DEXTROSE 2-4 GM/100ML-% IV SOLN
2.0000 g | Freq: Four times a day (QID) | INTRAVENOUS | Status: AC
  Administered 2022-11-08 – 2022-11-09 (×3): 2 g via INTRAVENOUS
  Filled 2022-11-08 (×3): qty 100

## 2022-11-08 MED ORDER — BUPIVACAINE LIPOSOME 1.3 % IJ SUSP
INTRAMUSCULAR | Status: AC
  Filled 2022-11-08: qty 10

## 2022-11-08 MED ORDER — DEXAMETHASONE SODIUM PHOSPHATE 10 MG/ML IJ SOLN
INTRAMUSCULAR | Status: AC
  Filled 2022-11-08: qty 1

## 2022-11-08 MED ORDER — LIDOCAINE HCL (CARDIAC) PF 100 MG/5ML IV SOSY
PREFILLED_SYRINGE | INTRAVENOUS | Status: DC | PRN
  Administered 2022-11-08: 100 mg via INTRAVENOUS

## 2022-11-08 MED ORDER — LIDOCAINE HCL (PF) 2 % IJ SOLN
INTRAMUSCULAR | Status: AC
  Filled 2022-11-08: qty 5

## 2022-11-08 MED ORDER — BUPIVACAINE LIPOSOME 1.3 % IJ SUSP
INTRAMUSCULAR | Status: AC
  Filled 2022-11-08: qty 20

## 2022-11-08 MED ORDER — FENTANYL CITRATE PF 50 MCG/ML IJ SOSY
PREFILLED_SYRINGE | INTRAMUSCULAR | Status: AC
  Administered 2022-11-08: 50 ug via INTRAVENOUS
  Filled 2022-11-08: qty 1

## 2022-11-08 MED ORDER — ONDANSETRON HCL 4 MG/2ML IJ SOLN
INTRAMUSCULAR | Status: AC
  Filled 2022-11-08: qty 2

## 2022-11-08 MED ORDER — PHENYLEPHRINE HCL (PRESSORS) 10 MG/ML IV SOLN
INTRAVENOUS | Status: AC
  Filled 2022-11-08: qty 1

## 2022-11-08 MED ORDER — 0.9 % SODIUM CHLORIDE (POUR BTL) OPTIME
TOPICAL | Status: DC | PRN
  Administered 2022-11-08: 1000 mL

## 2022-11-08 MED ORDER — PROPOFOL 10 MG/ML IV BOLUS
INTRAVENOUS | Status: AC
  Filled 2022-11-08: qty 40

## 2022-11-08 MED ORDER — ASPIRIN 81 MG PO TBEC
81.0000 mg | DELAYED_RELEASE_TABLET | Freq: Every day | ORAL | Status: DC
  Administered 2022-11-08: 81 mg via ORAL
  Filled 2022-11-08: qty 1

## 2022-11-08 MED ORDER — BUPIVACAINE LIPOSOME 1.3 % IJ SUSP
INTRAMUSCULAR | Status: DC | PRN
  Administered 2022-11-08: 20 mL

## 2022-11-08 MED ORDER — METOCLOPRAMIDE HCL 5 MG PO TABS: 5.0000 mg | ORAL_TABLET | Freq: Three times a day (TID) | ORAL | Status: DC | PRN

## 2022-11-08 MED ORDER — METOCLOPRAMIDE HCL 5 MG/ML IJ SOLN: 5.0000 mg | Freq: Three times a day (TID) | INTRAMUSCULAR | Status: DC | PRN

## 2022-11-08 MED ORDER — MIDAZOLAM HCL 2 MG/2ML IJ SOLN
1.0000 mg | Freq: Once | INTRAMUSCULAR | Status: AC
  Administered 2022-11-08: 1 mg via INTRAVENOUS

## 2022-11-08 MED ORDER — BUPIVACAINE HCL (PF) 0.5 % IJ SOLN
INTRAMUSCULAR | Status: DC | PRN
  Administered 2022-11-08: 10 mL

## 2022-11-08 MED ORDER — ACETAMINOPHEN 325 MG PO TABS
325.0000 mg | ORAL_TABLET | Freq: Four times a day (QID) | ORAL | Status: DC | PRN
  Administered 2022-11-09: 650 mg via ORAL
  Filled 2022-11-08: qty 2

## 2022-11-08 MED ORDER — LACTATED RINGERS IV SOLN: INTRAVENOUS | Status: DC

## 2022-11-08 MED ORDER — MAGNESIUM HYDROXIDE 400 MG/5ML PO SUSP: 30.0000 mL | Freq: Every day | ORAL | Status: DC | PRN

## 2022-11-08 MED ORDER — LORATADINE 10 MG PO TABS
5.0000 mg | ORAL_TABLET | Freq: Every evening | ORAL | Status: DC
  Administered 2022-11-08: 5 mg via ORAL
  Filled 2022-11-08: qty 1

## 2022-11-08 MED ORDER — BUPIVACAINE-EPINEPHRINE (PF) 0.5% -1:200000 IJ SOLN
INTRAMUSCULAR | Status: DC | PRN
  Administered 2022-11-08: 30 mL

## 2022-11-08 MED ORDER — MIDAZOLAM HCL 2 MG/2ML IJ SOLN
INTRAMUSCULAR | Status: AC
  Administered 2022-11-08: 1 mg via INTRAVENOUS
  Filled 2022-11-08: qty 2

## 2022-11-08 MED ORDER — POLYVINYL ALCOHOL 1.4 % OP SOLN: 1.0000 [drp] | Freq: Every day | OPHTHALMIC | Status: DC | PRN

## 2022-11-08 MED ORDER — ROCURONIUM BROMIDE 10 MG/ML (PF) SYRINGE
PREFILLED_SYRINGE | INTRAVENOUS | Status: AC
  Filled 2022-11-08: qty 10

## 2022-11-08 MED ORDER — KETOROLAC TROMETHAMINE 15 MG/ML IJ SOLN
7.5000 mg | Freq: Four times a day (QID) | INTRAMUSCULAR | Status: AC
  Administered 2022-11-08 – 2022-11-09 (×3): 7.5 mg via INTRAVENOUS
  Filled 2022-11-08 (×4): qty 1

## 2022-11-08 MED ORDER — FENTANYL CITRATE PF 50 MCG/ML IJ SOSY: 50.0000 ug | PREFILLED_SYRINGE | Freq: Once | INTRAMUSCULAR | Status: AC

## 2022-11-08 MED ORDER — FENTANYL CITRATE (PF) 100 MCG/2ML IJ SOLN
INTRAMUSCULAR | Status: DC | PRN
  Administered 2022-11-08: 50 ug via INTRAVENOUS

## 2022-11-08 MED ORDER — DIPHENHYDRAMINE HCL 12.5 MG/5ML PO ELIX: 12.5000 mg | ORAL_SOLUTION | ORAL | Status: DC | PRN

## 2022-11-08 MED ORDER — OXYCODONE HCL 5 MG/5ML PO SOLN: 5.0000 mg | Freq: Once | ORAL | Status: DC | PRN

## 2022-11-08 MED ORDER — ONDANSETRON HCL 4 MG/2ML IJ SOLN: 4.0000 mg | Freq: Once | INTRAMUSCULAR | Status: DC | PRN

## 2022-11-08 MED ORDER — OXYMETAZOLINE HCL 0.05 % NA SOLN: 1.0000 | Freq: Two times a day (BID) | NASAL | Status: DC | PRN

## 2022-11-08 MED ORDER — PHENYLEPHRINE 80 MCG/ML (10ML) SYRINGE FOR IV PUSH (FOR BLOOD PRESSURE SUPPORT)
PREFILLED_SYRINGE | INTRAVENOUS | Status: DC | PRN
  Administered 2022-11-08: 200 ug via INTRAVENOUS

## 2022-11-08 MED ORDER — KETOROLAC TROMETHAMINE 30 MG/ML IJ SOLN
INTRAMUSCULAR | Status: AC
  Filled 2022-11-08: qty 1

## 2022-11-08 MED ORDER — MIDAZOLAM HCL 2 MG/2ML IJ SOLN: 1.0000 mg | Freq: Once | INTRAMUSCULAR | Status: AC

## 2022-11-08 MED ORDER — KETOROLAC TROMETHAMINE 15 MG/ML IJ SOLN
15.0000 mg | Freq: Once | INTRAMUSCULAR | Status: AC
  Administered 2022-11-08: 15 mg via INTRAVENOUS

## 2022-11-08 MED ORDER — PHENYLEPHRINE HCL-NACL 20-0.9 MG/250ML-% IV SOLN
INTRAVENOUS | Status: DC | PRN
  Administered 2022-11-08: 50 ug/min via INTRAVENOUS

## 2022-11-08 MED ORDER — CEFAZOLIN SODIUM-DEXTROSE 2-4 GM/100ML-% IV SOLN
INTRAVENOUS | Status: AC
  Filled 2022-11-08: qty 100

## 2022-11-08 MED ORDER — CHLORHEXIDINE GLUCONATE 0.12 % MT SOLN
OROMUCOSAL | Status: AC
  Administered 2022-11-08: 15 mL via OROMUCOSAL
  Filled 2022-11-08: qty 15

## 2022-11-08 MED ORDER — ONDANSETRON HCL 4 MG/2ML IJ SOLN
INTRAMUSCULAR | Status: DC | PRN
  Administered 2022-11-08: 4 mg via INTRAVENOUS

## 2022-11-08 MED ORDER — FENTANYL CITRATE (PF) 100 MCG/2ML IJ SOLN: 25.0000 ug | INTRAMUSCULAR | Status: DC | PRN

## 2022-11-08 MED ORDER — PANTOPRAZOLE SODIUM 40 MG PO TBEC
40.0000 mg | DELAYED_RELEASE_TABLET | Freq: Every day | ORAL | Status: DC
  Administered 2022-11-09: 40 mg via ORAL
  Filled 2022-11-08: qty 1

## 2022-11-08 MED ORDER — FLEET ENEMA 7-19 GM/118ML RE ENEM: 1.0000 | ENEMA | Freq: Once | RECTAL | Status: DC | PRN

## 2022-11-08 MED ORDER — KETOROLAC TROMETHAMINE 15 MG/ML IJ SOLN
INTRAMUSCULAR | Status: AC
  Filled 2022-11-08: qty 1

## 2022-11-08 MED ORDER — ONDANSETRON HCL 4 MG PO TABS: 4.0000 mg | ORAL_TABLET | Freq: Four times a day (QID) | ORAL | Status: DC | PRN

## 2022-11-08 MED ORDER — DEXAMETHASONE SODIUM PHOSPHATE 10 MG/ML IJ SOLN
INTRAMUSCULAR | Status: DC | PRN
  Administered 2022-11-08: 10 mg via INTRAVENOUS

## 2022-11-08 MED ORDER — GLYCOPYRROLATE 0.2 MG/ML IJ SOLN
INTRAMUSCULAR | Status: AC
  Filled 2022-11-08: qty 1

## 2022-11-08 MED ORDER — BUPIVACAINE-EPINEPHRINE (PF) 0.5% -1:200000 IJ SOLN
INTRAMUSCULAR | Status: AC
  Filled 2022-11-08: qty 30

## 2022-11-08 MED ORDER — DOCUSATE SODIUM 100 MG PO CAPS
100.0000 mg | ORAL_CAPSULE | Freq: Two times a day (BID) | ORAL | Status: DC
  Administered 2022-11-08 – 2022-11-09 (×3): 100 mg via ORAL
  Filled 2022-11-08 (×3): qty 1

## 2022-11-08 MED ORDER — ONDANSETRON HCL 4 MG/2ML IJ SOLN: 4.0000 mg | Freq: Four times a day (QID) | INTRAMUSCULAR | Status: DC | PRN

## 2022-11-08 MED ORDER — TAMSULOSIN HCL 0.4 MG PO CAPS
0.4000 mg | ORAL_CAPSULE | Freq: Every evening | ORAL | Status: DC
  Administered 2022-11-08: 0.4 mg via ORAL
  Filled 2022-11-08: qty 1

## 2022-11-08 MED ORDER — SODIUM CHLORIDE 0.9 % IR SOLN
Status: DC | PRN
  Administered 2022-11-08: 3000 mL

## 2022-11-08 SURGICAL SUPPLY — 66 items
BASEPLATE GLENOSPHERE 25 (Plate) IMPLANT
BEARING HUMERAL 40 STD VITE (Joint) IMPLANT
BIT DRILL TWIST 2.7 (BIT) IMPLANT
BLADE SAW SAG 25X90X1.19 (BLADE) ×1 IMPLANT
CHLORAPREP W/TINT 26 (MISCELLANEOUS) ×1 IMPLANT
COOLER POLAR GLACIER W/PUMP (MISCELLANEOUS) ×1 IMPLANT
COVER BACK TABLE REUSABLE LG (DRAPES) ×1 IMPLANT
DIAL VERSA SHOULDER 40 STD (Joint) IMPLANT
DRAPE 3/4 80X56 (DRAPES) ×1 IMPLANT
DRAPE INCISE IOBAN 66X45 STRL (DRAPES) ×1 IMPLANT
DRSG OPSITE POSTOP 4X8 (GAUZE/BANDAGES/DRESSINGS) ×1 IMPLANT
ELECT BLADE 6.5 EXT (BLADE) IMPLANT
ELECT CAUTERY BLADE 6.4 (BLADE) ×1 IMPLANT
ELECT REM PT RETURN 9FT ADLT (ELECTROSURGICAL) ×1
ELECTRODE REM PT RTRN 9FT ADLT (ELECTROSURGICAL) ×1 IMPLANT
GAUZE XEROFORM 1X8 LF (GAUZE/BANDAGES/DRESSINGS) ×1 IMPLANT
GLOVE BIO SURGEON STRL SZ7.5 (GLOVE) ×4 IMPLANT
GLOVE BIO SURGEON STRL SZ8 (GLOVE) ×4 IMPLANT
GLOVE BIOGEL PI IND STRL 8 (GLOVE) ×2 IMPLANT
GLOVE SURG UNDER LTX SZ8 (GLOVE) ×1 IMPLANT
GOWN STRL REUS W/ TWL LRG LVL3 (GOWN DISPOSABLE) ×1 IMPLANT
GOWN STRL REUS W/ TWL XL LVL3 (GOWN DISPOSABLE) ×1 IMPLANT
GOWN STRL REUS W/TWL LRG LVL3 (GOWN DISPOSABLE) ×1
GOWN STRL REUS W/TWL XL LVL3 (GOWN DISPOSABLE) ×2
HOOD PEEL AWAY T7 (MISCELLANEOUS) ×3 IMPLANT
IV NS IRRIG 3000ML ARTHROMATIC (IV SOLUTION) ×1 IMPLANT
KIT STABILIZATION SHOULDER (MISCELLANEOUS) ×1 IMPLANT
KIT TURNOVER KIT A (KITS) ×1 IMPLANT
MANIFOLD NEPTUNE II (INSTRUMENTS) ×1 IMPLANT
MASK FACE SPIDER DISP (MASK) ×1 IMPLANT
MAT ABSORB  FLUID 56X50 GRAY (MISCELLANEOUS) ×1
MAT ABSORB FLUID 56X50 GRAY (MISCELLANEOUS) ×1 IMPLANT
NDL MAYO CATGUT SZ1 (NEEDLE) IMPLANT
NDL SAFETY ECLIP 18X1.5 (MISCELLANEOUS) ×1 IMPLANT
NDL SPNL 20GX3.5 QUINCKE YW (NEEDLE) ×1 IMPLANT
NEEDLE MAYO CATGUT SZ1 (NEEDLE) IMPLANT
NEEDLE SPNL 20GX3.5 QUINCKE YW (NEEDLE) ×1 IMPLANT
NS IRRIG 500ML POUR BTL (IV SOLUTION) ×1 IMPLANT
PACK ARTHROSCOPY SHOULDER (MISCELLANEOUS) ×1 IMPLANT
PAD ARMBOARD 7.5X6 YLW CONV (MISCELLANEOUS) ×1 IMPLANT
PAD WRAPON POLAR SHDR UNIV (MISCELLANEOUS) ×1 IMPLANT
PIN THREADED REVERSE (PIN) IMPLANT
PULSAVAC PLUS IRRIG FAN TIP (DISPOSABLE) ×1
SCREW BONE LOCKING 4.75X35X3.5 (Screw) IMPLANT
SCREW BONE STRL 6.5MMX30MM (Screw) IMPLANT
SCREW LOCKING 4.75MMX15MM (Screw) IMPLANT
SCREW LOCKING NS 4.75MMX20MM (Screw) IMPLANT
SCREW NON-LOCK 4.75X20X3.5 (Screw) IMPLANT
SLING ULTRA II M (MISCELLANEOUS) IMPLANT
SPONGE T-LAP 18X18 ~~LOC~~+RFID (SPONGE) ×2 IMPLANT
STAPLER SKIN PROX 35W (STAPLE) ×1 IMPLANT
STEM HUMERAL MICRO SZ14 (Stem) IMPLANT
SUT ETHIBOND 0 MO6 C/R (SUTURE) ×1 IMPLANT
SUT FIBERWIRE #2 38 BLUE 1/2 (SUTURE)
SUT VIC AB 0 CT1 36 (SUTURE) ×1 IMPLANT
SUT VIC AB 2-0 CT1 27 (SUTURE) ×2
SUT VIC AB 2-0 CT1 TAPERPNT 27 (SUTURE) ×2 IMPLANT
SUTURE FIBERWR #2 38 BLUE 1/2 (SUTURE) ×4 IMPLANT
SYR 10ML LL (SYRINGE) ×1 IMPLANT
SYR 30ML LL (SYRINGE) ×1 IMPLANT
SYR TOOMEY 50ML (SYRINGE) ×1 IMPLANT
TIP FAN IRRIG PULSAVAC PLUS (DISPOSABLE) ×1 IMPLANT
TRAP FLUID SMOKE EVACUATOR (MISCELLANEOUS) ×1 IMPLANT
TRAY HUMERAL NEUTRAL EXT 6 (Shoulder) IMPLANT
WATER STERILE IRR 500ML POUR (IV SOLUTION) ×1 IMPLANT
WRAPON POLAR PAD SHDR UNIV (MISCELLANEOUS) ×1

## 2022-11-08 NOTE — Anesthesia Postprocedure Evaluation (Signed)
Anesthesia Post Note  Patient: Ryan Benjamin  Procedure(s) Performed: REVERSE SHOULDER ARTHROPLASTY (Right: Shoulder)  Patient location during evaluation: PACU Anesthesia Type: Regional Level of consciousness: awake and alert, oriented and patient cooperative Pain management: pain level controlled Vital Signs Assessment: post-procedure vital signs reviewed and stable Respiratory status: spontaneous breathing, nonlabored ventilation and respiratory function stable Cardiovascular status: blood pressure returned to baseline and stable Postop Assessment: adequate PO intake Anesthetic complications: no   No notable events documented.   Last Vitals:  Vitals:   11/08/22 1045 11/08/22 1104  BP: 115/74 104/77  Pulse: 95 94  Resp:  17  Temp:  (!) 36.4 C  SpO2: 92% 94%    Last Pain:  Vitals:   11/08/22 0633  TempSrc: Temporal  PainSc: 0-No pain                 Reed Breech

## 2022-11-08 NOTE — Progress Notes (Signed)
   11/08/22 1343  TOC Assessment  TOC screening is complete Yes  Expected Discharge [place]  Final next level of care [place]  Barriers to Discharge Barriers Resolved  Living arrangements for the past 2 months Single Family Home  Lives with: Self  Current home services DME  Discharge Planning Services CM Consult  DME Arranged N/A  HH Arranged PT;OT  [place]  Date [place] 11/08/22  Time [place] 1345  Representative spoke with at HH [facility] [place]  Do you feel safe going back to the place where you live? Y  Permission granted to share information with  Yes, Verbal Permission Granted  Patient language and need for interpreter reviewed: Yes  Criminal Activity/Legal Involvement Pertinent to Current Situation/Hospitalization No - Comment as needed  Need for Family Participation in Patient Care Y  Care giver support system in place? Y  Appearance: Appears stated age  Attitude/Demeanor/Rapport Engaged  Affect (typically observed) Pleasant  Orientation:  Oriented to Self;Oriented to Place;Oriented to  Time;Oriented to Situation  Alcohol / Substance Use Not Applicable  Psych Involvement N   Met with the patient in the room He lives alone but has assistance He has had the other shoulder done in the past He does not need DME,  He is set up with [facility] services prior to surgery by the surgeons office

## 2022-11-08 NOTE — Transfer of Care (Signed)
Immediate Anesthesia Transfer of Care Note  Patient: Ryan Benjamin  Procedure(s) Performed: REVERSE SHOULDER ARTHROPLASTY (Right: Shoulder)  Patient Location: PACU  Anesthesia Type:General, Regional and GA combined with regional for post-op pain  Level of Consciousness: drowsy  Airway & Oxygen Therapy: Patient Spontanous Breathing and Patient connected to face mask oxygen  Post-op Assessment: Report given to RN and Post -op Vital signs reviewed and stable  Post vital signs: stable  Last Vitals:  Vitals Value Taken Time  BP 95/60 11/08/22 1006  Temp    Pulse 95 11/08/22 1011  Resp    SpO2 95 % 11/08/22 1011  Vitals shown include unvalidated device data.  Last Pain:  Vitals:   11/08/22 0633  TempSrc: Temporal  PainSc: 0-No pain         Complications: No notable events documented.

## 2022-11-08 NOTE — Op Note (Signed)
11/08/2022  9:53 AM  Patient:   Ryan Benjamin  Pre-Op Diagnosis:   Massive irreparable rotator cuff tear, right shoulder.  Post-Op Diagnosis:   Same  Procedure:   Reverse right total shoulder arthroplasty.  Surgeon:   Maryagnes Amos, MD  Assistant:   Horris Latino, PA-C  Anesthesia:   General endotracheal with an interscalene block using Exparel placed preoperatively by the anesthesiologist.  Findings:   As above.  Complications:   None  EBL:   100 cc  Fluids:   1000 cc crystalloid  UOP:   None  TT:   None  Drains:   None  Closure:   Staples  Implants:   All press-fit Zimmer-Biomet Comprehensive system with a 14 mm Identity micro-humeral stem, a -6 mm extended neutral identity humeral tray with a +0 mm insert, and a mini-base plate with a 40 mm concentric glenosphere.  Brief Clinical Note:   The patient is a 76 year old male with a long history of progressively worsening pain and weakness of his right shoulder. His symptoms have progressed despite medications, activity modification, etc. His history and examination consistent with a massive irreparable rotator cuff tear which was confirmed by MRI scan preoperatively. The patient presents at this time for a reverse right total shoulder arthroplasty.  Procedure:   The patient underwent placement of an interscalene block using Exparel by the anesthesiologist in the preoperative holding area before being brought into the operating room and lain in the supine position. The patient then underwent general endotracheal intubation and anesthesia before the patient was repositioned in the beach chair position using the beach chair positioner. The right shoulder and upper extremity were prepped with ChloraPrep solution before being draped sterilely. Preoperative antibiotics were administered. A timeout was performed to verify the appropriate surgical site.    A standard anterior approach to the shoulder was made through an approximately  4-5 inch incision. The incision was carried down through the subcutaneous tissues to expose the deltopectoral fascia. The interval between the deltoid and pectoralis muscles was identified and this plane developed, retracting the cephalic vein laterally with the deltoid muscle. The conjoined tendon was identified. Its lateral margin was dissected and the Kolbel self-retraining retractor inserted. The "three sisters" were identified and cauterized. Bursal tissues were removed to improve visualization.   The subscapularis tendon was released from its attachment to the lesser tuberosity 1 cm proximal to its insertion and several tagging sutures placed. The inferior capsule was released with care after identifying and protecting the axillary nerve. The proximal humeral cut was made at approximately 25 of retroversion using the extra-medullary guide.   Attention was redirected to the glenoid. The labrum was debrided circumferentially before the center of the glenoid was marked with electrocautery. The guidewire was drilled into the glenoid vault using the appropriate guide. After verifying its position, it was overreamed with the mini-baseplate reamer to create a flat surface. The permanent mini-baseplate was impacted into place. It was stabilized with a 30 x 6.5 mm central screw and four peripheral screws (locking screws superiorly, inferiorly, and posteriorly, and a nonlocking screw anteriorly). The permanent 40 mm concentric glenosphere was then impacted into place and its Morse taper locking mechanism verified using manual distraction.  Attention was directed to the humeral side. The humeral canal was reamed sequentially beginning with the end-cutting reamer then progressing from a 4 mm reamer up to a 14 mm reamer. This provided excellent circumferential chatter. The canal was broached beginning with a # 12 broach  and progressing to a # 14 broach.  The plastic stem was inserted into the end of the broach and  the proximal reaming performed. A trial reduction was performed using the -6 mm extended neutral humeral platform with the +0 mm insert. With the +0 mm insert, the arm demonstrated excellent range of motion as the hand could be brought across the chest to the opposite shoulder and brought to the top of the patient's head and to the patient's ear. The shoulder appeared stable throughout this range of motion. The joint was dislocated and the trial components removed.   The permanent # 14 identity micro-stem was connected with the -6 mm extended neutral humeral platform on the back table before this construct was impacted into place with care taken to maintain the appropriate version. The +0 mm insert was impacted into place. The shoulder was relocated using two finger pressure and again placed through a range of motion with the findings as described above.  The wound was copiously irrigated with sterile saline solution using the jet lavage system before a total of 30 cc of 0.5% Sensorcaine with epinephrine was injected into the pericapsular and peri-incisional tissues to help with postoperative analgesia. The subscapularis tendon was reapproximated using #2 FiberWire interrupted sutures. The deltopectoral interval was closed using #0 Vicryl interrupted sutures before the subcutaneous tissues were closed using 2-0 Vicryl interrupted sutures. The skin was closed using staples. Prior to closing the skin, 1 g of transexemic acid in 10 cc of normal saline was injected intra-articularly to help with postoperative bleeding. A sterile occlusive dressing was applied to the wound before the arm was placed into a shoulder immobilizer with an abduction pillow. A Polar Care system also was applied to the shoulder. The patient was then transferred back to a hospital bed before being awakened, extubated, and returned to the recovery room in satisfactory condition after tolerating the procedure well.

## 2022-11-08 NOTE — Anesthesia Procedure Notes (Signed)
Procedure Name: Intubation Date/Time: 11/08/2022 7:47 AM  Performed by: Jasper Loser, RNPre-anesthesia Checklist: Patient identified, Emergency Drugs available, Suction available and Patient being monitored Patient Re-evaluated:Patient Re-evaluated prior to induction Oxygen Delivery Method: Circle system utilized Preoxygenation: Pre-oxygenation with 100% oxygen Induction Type: IV induction Ventilation: Mask ventilation without difficulty Laryngoscope Size: McGraph and 3 Grade View: Grade I Tube type: Oral Tube size: 7.5 mm Number of attempts: 1 Airway Equipment and Method: Stylet and Oral airway Placement Confirmation: ETT inserted through vocal cords under direct vision, positive ETCO2 and breath sounds checked- equal and bilateral Secured at: 21 cm Tube secured with: Tape Dental Injury: Teeth and Oropharynx as per pre-operative assessment

## 2022-11-08 NOTE — H&P (Signed)
History of Present Illness: Ryan Benjamin is a 76 y.o. who presents today for a history and physical. He is to undergo a reverse right total shoulder arthroplasty on 11/08/2022. Since the last visit there have been no improvement in his condition and patient expresses his desires to continue with the surgery.  The patient's symptoms began many years ago and developed without any specific cause or injury. His symptoms were intermittent and were able to be managed conservatively with medications and an occasional steroid injection. However, about 5 months ago, he developed increased pain in his shoulder and saw Horris Latino, PA-C, who gave him a steroid injection which provided moderate relief of his symptoms. However, he continues to have a constant level of pain in his shoulder which he rates at 4-5/10. Therefore, the patient was sent for an MRI scan and referred to me for further evaluation and treatment. The patient is now 3 years status post a reverse left total shoulder arthroplasty for which he has been doing quite well. On today's visit, the patient describes the symptoms as moderate (patient is active but has had to make modifications or give up activities) and have the quality of being aching, constant, miserable, nagging, stabbing, tender, and throbbing. The pain is localized to the lateral arm/shoulder and localized to the anterior shoulder. These symptoms are aggravated with normal daily activities, with sleeping, carrying heavy objects, at higher levels of activity, with overhead activity, and reaching behind the back. He has tried acetaminophen and non-steroidal anti-inflammatories (Voltaren) with temporary partial relief of his symptoms. He has tried rest , ice , and heat with limited benefit. He has tried several steroid injections, as well as doing exercises for his right shoulder that he learned when recuperating from his left shoulder surgery, all without sustained benefit. The patient denies any  neck pain, nor does he note any numbness or paresthesias down his arm to his hand.   Past Medical History: Benign hypertension  Environmental allergies  GERD (gastroesophageal reflux disease)  Hyperlipidemia  Osteoarthritis  Rosacea   Past Surgical History Past Surgical History:  Left TKA using all cemented biomet vanguard system with a 65 mm PCR femur a 71 mm tibial tray with a 10 mm anterior stabilized e-poly insert, and a 34 x 8.5 mm all-poly 3-pegged domed patella Left 06/30/2019 (Dr. Joice Lofts)  Left reverse total shoulder arthroplasty 06/30/2019 (Dr  Joice Lofts)  Cystoscopy with stent placement 10/15/2019   Past Family History: History reviewed. No pertinent family history.  Medications: acetaminophen (TYLENOL) 650 MG ER tablet Take by mouth Take 1,300 mg by mouth daily.  aspirin 81 MG EC tablet Take 1 tablet (81 mg total) by mouth once daily  atorvastatin (LIPITOR) 20 MG tablet Take 1 tablet (20 mg total) by mouth once daily 90 tablet 3  azelastine (ASTELIN) 137 mcg nasal spray Place 1 spray into both nostrils 2 (two) times daily 10 mL 1  diclofenac (VOLTAREN) 75 MG EC tablet Take 1 tablet (75 mg total) by mouth 2 (two) times daily with meals 60 tablet 1  fluticasone propionate (FLONASE) 50 mcg/actuation nasal spray Place 1 spray into both nostrils 2 (two) times daily 16 g 0  levocetirizine (XYZAL) 5 MG tablet Take 5 mg by mouth every evening  loratadine (CLARITIN) 10 mg tablet Take by mouth Take 10 mg by mouth at bedtime. (Patient not taking: Reported on 09/24/2022)  omeprazole (PRILOSEC) 40 MG DR capsule Take 1 capsule (40 mg total) by mouth once daily 90 capsule 3  oxymetazoline (AFRIN) 0.05 % nasal spray Place into one nostril Place 1 spray into both nostrils 2 (two) times daily as needed for congestion.  sildenafiL (VIAGRA) 100 MG tablet Take 1 tablet (100 mg total) by mouth once daily as needed for Erectile Dysfunction for up to 30 days 30 tablet 1  tamsulosin (FLOMAX) 0.4 mg  capsule Take 1 capsule (0.4 mg total) by mouth once daily Take 30 minutes after same meal each day. 90 capsule 3   Allergies: No Known Allergies   Review of Systems: A comprehensive 14 point ROS was performed, reviewed, and the pertinent orthopaedic findings are documented in the HPI.  Physical Exam: BP 132/72 (BP Location: Left upper arm, Patient Position: Sitting, BP Cuff Size: Large Adult)  Ht 170.2 cm (5\' 7" )  Wt 100.2 kg (221 lb)  BMI 34.61 kg/m   General: Well-developed well-nourished male seen in no acute distress.   HEENT: Atraumatic,normocephalic. Pupils are equal and reactive to light. Oropharynx is clear with moist mucosa  Lungs: Clear to auscultation bilaterally   Cardiovascular: Regular rate and rhythm. Normal S1, S2. No murmurs. No appreciable gallops or rubs. Peripheral pulses are palpable.  Abdomen: Soft, non-tender, nondistended. Bowel sounds present  Right shoulder exam: SKIN: normal SWELLING: none WARMTH: none LYMPH NODES: no adenopathy palpable CREPITUS: none TENDERNESS: Mildly tender over anterolateral shoulder ROM (active):  Forward flexion: 155 degrees Abduction: 150 degrees Internal rotation: L2 ROM (passive):  Forward flexion: 165 degrees Abduction: 160 degrees  ER/IR at 90 abd: 90 degrees / 55 degrees  He describes mild pain at the extremes of all motions.  STRENGTH: Forward flexion: 4/5 Abduction: 4/5 External rotation: 4-4+/5 Internal rotation: 4+/5 Pain with RC testing: Mild pain with resisted forward flexion and abduction  STABILITY: Normal  SPECIAL TESTS: ' test: positive, mild Speed's test: negative Capsulitis - pain w/ passive ER: no Crossed arm test: Minimally positive Crank: Not evaluated Anterior apprehension: Negative Posterior apprehension: Not evaluated  Neurological: The patient is alert and oriented Sensation to light touch appears to be intact and within normal limits Gross motor strength appeared to  be equal to 5/5  Vascular : Peripheral pulses felt to be palpable.  Right Shoulder Imaging, MRI: MRI Shoulder Cartilage: No cartilage abnormality. MRI Shoulder Rotator Cuff: Full thickness tear of the supraspinatus and infraspinatus tendons, as well as a partial-thickness tear of the subscapularis tendon. The supraspinatus and infraspinatus tendons have retracted back to the glenohumeral joint. Moderate atrophy of the infraspinatus more so than the supraspinatus or subscapularis tendon send is noted MRI Shoulder Labrum / Biceps: Biceps tendinopathy. MRI Shoulder Bone: Normal bone.  Impression: 1. Nontraumatic complete tear of right rotator cuff 2. Injury to tendon of long head of the right biceps  Plan:  The treatment options were discussed with the patient. In addition, patient educational materials were provided regarding the diagnosis and treatment options. The patient is frustrated by his symptoms and functional limitations, and is ready to consider more aggressive treatment options. Therefore, I have recommended a surgical procedure, specifically a reverse right total shoulder arthroplasty. The procedure was discussed with the patient, as were the potential risks (including bleeding, infection, nerve and/or blood vessel injury, persistent or recurrent pain, loosening and/or failure of the components, dislocation, need for further surgery, blood clots, strokes, heart attacks and/or arhythmias, pneumonia, etc.) and benefits. The patient states his/her understanding and wishes to proceed. All of the patient's questions and concerns were answered. He can call any time with further concerns. He will follow  up post-surgery, routine.     H&P reviewed and patient re-examined. No changes.

## 2022-11-08 NOTE — Evaluation (Signed)
Occupational Therapy Evaluation Patient Details Name: Ryan Benjamin MRN: [id number] DOB: 05-26-1946 Today's Date: 11/08/2022   History of Present Illness pt is s/p R rTSA 11/08/22; PMH significant for Benign hypertension   Environmental allergies   GERD (gastroesophageal reflux disease)   Hyperlipidemia   Osteoarthritis   Rosacea, L TKA, L rTSA   Clinical Impression   Chart reviewed, pt greeted in bed agreeable to OT evaluation.Pt lives alone, says his brother will assist with IADLs as needed following discharge. Prior to surgery, pt was active and independent. Pt has orders for RUE to be immobilized and will be NWBing per MD. Patient presents with impaired strength/ROM, pain, and sensation to RUE with block not completely resolved yet. These impairments result in a decreased ability to perform self care tasks requiring MOD A assist for UB dressing and bathing. Pt provided education re: polar care mgt, sling/immobilizer mgt, ROM exercises for RUE, RUE precautions, adaptive strategies for bathing/dressing/toileting/grooming, positioning and considerations for sleep, and home/routines modifications to maximize falls prevention, safety, and independence. Handout provided. OT adjusted sling/immobilizer and polar care to improve comfort, optimize positioning, and to maximize skin integrity/safety. Pt verbalized understanding of all education/training provided. Pt will benefit from skilled OT services to address these limitations and improve independence in daily tasks. OT will continue to follow acutely.    Recommendations for follow up therapy are one component of a multi-disciplinary discharge planning process, led by the attending physician.  Recommendations may be updated based on patient status, additional functional criteria and insurance authorization.   Follow Up Recommendations  Follow physician's recommendations for discharge plan and follow up therapies     Assistance Recommended at Discharge  Intermittent Supervision/Assistance  Patient can return home with the following A little help with bathing/dressing/bathroom    Functional Status Assessment  Patient has had a recent decline in their functional status and demonstrates the ability to make significant improvements in function in a reasonable and predictable amount of time.  Equipment Recommendations  None recommended by OT;Other (comment) (pt has bsc)    Recommendations for Other Services       Precautions / Restrictions Precautions Precautions: Fall;Shoulder Shoulder Interventions: Shoulder sling/immobilizer;Shoulder abduction pillow Required Braces or Orthoses: Sling Restrictions Weight Bearing Restrictions: Yes RUE Weight Bearing: Non weight bearing      Mobility Bed Mobility Overal bed mobility: Needs Assistance Bed Mobility: Supine to Sit, Sit to Supine     Supine to sit: Supervision Sit to supine: Supervision        Transfers Overall transfer level: Needs assistance Equipment used: None Transfers: Sit to/from Stand Sit to Stand: Supervision                  Balance Overall balance assessment: Needs assistance Sitting-balance support: Feet supported Sitting balance-Leahy Scale: Good     Standing balance support: No upper extremity supported Standing balance-Leahy Scale: Good                             ADL either performed or assessed with clinical judgement   ADL Overall ADL's : Needs assistance/impaired Eating/Feeding: Set up;Sitting   Grooming: Wash/dry hands;Standing;Supervision/safety           Upper Body Dressing : Moderate assistance Upper Body Dressing Details (indicate cue type and reason): sling Lower Body Dressing: Minimal assistance;Sit to/from stand   Toilet Transfer: Economist and Hygiene: Supervision/safety;Sitting/lateral Tax adviser Details (  indicate cue type and  reason): anticipated     Functional mobility during ADLs: Min guard (approx 200' in hallway)       Vision Patient Visual Report: No change from baseline       Perception     Praxis      Pertinent Vitals/Pain Pain Assessment Pain Assessment: No/denies pain     Hand Dominance Right   Extremity/Trunk Assessment Upper Extremity Assessment Upper Extremity Assessment: RUE deficits/detail RUE Deficits / Details: shoulder NT; elbow less than 1/4 AROM, forearm 3/4 full AROM, wrist 3/4 full AROM, hand able to wiggle fingers RUE Sensation: decreased light touch RUE Coordination: decreased fine motor;decreased gross motor   Lower Extremity Assessment Lower Extremity Assessment: Overall WFL for tasks assessed   Cervical / Trunk Assessment Cervical / Trunk Assessment: Normal   Communication Communication Communication: No difficulties   Cognition Arousal/Alertness: Awake/alert Behavior During Therapy: WFL for tasks assessed/performed Overall Cognitive Status: Within Functional Limits for tasks assessed                                       General Comments  vitals monitored, appear stable throughout    Exercises     Shoulder Instructions      Home Living Family/patient expects to be discharged to:: Private residence Living Arrangements: Alone Available Help at Discharge: Family;Available PRN/intermittently Type of Home: House Home Access: Level entry     Home Layout: One level     Bathroom Shower/Tub: Walk-in shower         Home Equipment: Shower seat - built in;BSC/3in1          Prior Functioning/Environment Prior Level of Function : Independent/Modified Independent                        OT Problem List: Decreased strength;Decreased activity tolerance      OT Treatment/Interventions: Self-care/ADL training;Patient/family education;Energy conservation;Therapeutic activities;DME and/or AE instruction;Balance training    OT  Goals(Current goals can be found in the care plan section) Acute Rehab OT Goals Patient Stated Goal: go home OT Goal Formulation: With patient Time For Goal Achievement: 11/22/22 Potential to Achieve Goals: Good ADL Goals Pt Will Perform Grooming: with modified independence Pt Will Perform Upper Body Dressing: with modified independence Pt Will Perform Lower Body Dressing: with modified independence;sit to/from stand Pt Will Transfer to Toilet: with modified independence;ambulating Pt Will Perform Toileting - Clothing Manipulation and hygiene: with modified independence  OT Frequency: Min 2X/week    Co-evaluation              AM-PAC OT "6 Clicks" Daily Activity     Outcome Measure Help from another person eating meals?: None Help from another person taking care of personal grooming?: None Help from another person toileting, which includes using toliet, bedpan, or urinal?: A Little Help from another person bathing (including washing, rinsing, drying)?: A Little Help from another person to put on and taking off regular upper body clothing?: A Little Help from another person to put on and taking off regular lower body clothing?: A Little 6 Click Score: 20   End of Session Nurse Communication: Mobility status  Activity Tolerance: Patient tolerated treatment well Patient left: in chair;with call bell/phone within reach  OT Visit Diagnosis: Other abnormalities of gait and mobility (R26.89)  Time: [id number] OT Time Calculation (min): 24 min Charges:  OT General Charges $OT Visit: 1 Visit OT Evaluation $OT Eval Low Complexity: 1 Low  Shanon Payor, OTD OTR/L  11/08/22, 3:59 PM

## 2022-11-08 NOTE — Anesthesia Preprocedure Evaluation (Addendum)
Anesthesia Evaluation  Patient identified by MRN, date of birth, ID band Patient awake    Reviewed: Allergy & Precautions, NPO status , Patient's Chart, lab work & pertinent test results  History of Anesthesia Complications Negative for: history of anesthetic complications  Airway Mallampati: III   Neck ROM: Full    Dental  (+) Missing   Pulmonary neg pulmonary ROS   Pulmonary exam normal breath sounds clear to auscultation       Cardiovascular Exercise Tolerance: Good Normal cardiovascular exam Rhythm:Regular Rate:Normal  ECG 11/01/22:  Normal sinus rhythm Minimal voltage criteria for LVH, may be normal variant ( R in aVL ) Abnormal QRS-T angle, consider primary T wave abnormality   Neuro/Psych HOH    GI/Hepatic ,GERD  ,,Sullivan Lone syndrome   Endo/Other  Obesity   Renal/GU Renal disease (nephrolithiasis)   BPH    Musculoskeletal  (+) Arthritis ,    Abdominal   Peds  Hematology negative hematology ROS (+)   Anesthesia Other Findings   Reproductive/Obstetrics                             Anesthesia Physical Anesthesia Plan  ASA: 2  Anesthesia Plan: General and Regional   Post-op Pain Management: Regional block*   Induction: Intravenous  PONV Risk Score and Plan: 2 and Ondansetron, Dexamethasone and Treatment may vary due to age or medical condition  Airway Management Planned: Oral ETT  Additional Equipment:   Intra-op Plan:   Post-operative Plan: Extubation in OR  Informed Consent: I have reviewed the patients History and Physical, chart, labs and discussed the procedure including the risks, benefits and alternatives for the proposed anesthesia with the patient or authorized representative who has indicated his/her understanding and acceptance.     Dental advisory given  Plan Discussed with: CRNA  Anesthesia Plan Comments: (Plan for preoperative interscalene nerve  block and GETA.  Patient consented for risks of anesthesia including but not limited to:  - adverse reactions to medications - damage to eyes, teeth, lips or other oral mucosa - nerve damage due to positioning  - sore throat or hoarseness - damage to heart, brain, nerves, lungs, other parts of body or loss of life  Informed patient about role of CRNA in peri- and intra-operative care.  Patient voiced understanding.)        Anesthesia Quick Evaluation

## 2022-11-08 NOTE — Anesthesia Procedure Notes (Signed)
Anesthesia Regional Block: Interscalene brachial plexus block   Pre-Anesthetic Checklist: , timeout performed,  Correct Patient, Correct Site, Correct Laterality,  Correct Procedure,, risks and benefits discussed,  Surgical consent,  Pre-op evaluation,  At surgeon's request and post-op pain management  Laterality: Right  Prep: chloraprep       Needles:  Injection technique: Single-shot  Needle Type: Echogenic Needle          Additional Needles:   Procedures:,,,, ultrasound used (permanent image in chart),,   Motor weakness within 20 minutes.  Narrative:  Start time: 11/08/2022 7:26 AM End time: 11/08/2022 7:28 AM Injection made incrementally with aspirations every 5 mL.  Performed by: Personally  Anesthesiologist: Reed Breech, MD  Additional Notes: Functioning IV was confirmed and monitors applied.  Sterile prep and drape, hand hygiene and sterile gloves were used. Ultrasound guidance: relevant anatomy identified, needle position confirmed, local anesthetic spread visualized around nerve(s), vascular puncture avoided.  Image saved to electronic medical record.  Negative aspiration prior to incremental administration of local anesthetic for total 20 ml Exparel and 10 ml bupivacaine 0.5% given in interscalene distribution. The patient tolerated the procedure well. Vital signs and moderate sedation medications recorded in RN notes.

## 2022-11-08 NOTE — Progress Notes (Signed)
PT Cancellation Note  Patient Details Name: Ryan Benjamin MRN: [id number] DOB: 1946-09-24   Cancelled Treatment:    Reason Eval/Treat Not Completed: PT screened, no needs identified, will sign off. Patient ambulated 160 feet (around nursing station) without AD with OT. Patient reports he felt safe, and has no concerns. Will sign off at this time.    Niza Soderholm 11/08/2022, 2:31 PM

## 2022-11-09 ENCOUNTER — Encounter: Payer: Self-pay | Admitting: Surgery

## 2022-11-09 DIAGNOSIS — M75121 Complete rotator cuff tear or rupture of right shoulder, not specified as traumatic: Secondary | ICD-10-CM | POA: Diagnosis not present

## 2022-11-09 LAB — CBC
HCT: 38.2 % — ABNORMAL LOW (ref 39.0–52.0)
Hemoglobin: 13.2 g/dL (ref 13.0–17.0)
MCH: 31.4 pg (ref 26.0–34.0)
MCHC: 34.6 g/dL (ref 30.0–36.0)
MCV: 90.7 fL (ref 80.0–100.0)
Platelets: 185 10*3/uL (ref 150–400)
RBC: 4.21 MIL/uL — ABNORMAL LOW (ref 4.22–5.81)
RDW: 13.1 % (ref 11.5–15.5)
WBC: 14.2 10*3/uL — ABNORMAL HIGH (ref 4.0–10.5)
nRBC: 0 % (ref 0.0–0.2)

## 2022-11-09 LAB — BASIC METABOLIC PANEL
Anion gap: 6 (ref 5–15)
BUN: 23 mg/dL (ref 8–23)
CO2: 25 mmol/L (ref 22–32)
Calcium: 8.7 mg/dL — ABNORMAL LOW (ref 8.9–10.3)
Chloride: 106 mmol/L (ref 98–111)
Creatinine, Ser: 0.93 mg/dL (ref 0.61–1.24)
GFR, Estimated: >60 mL/min (ref >60)
Glucose, Bld: 112 mg/dL — ABNORMAL HIGH (ref 70–99)
Potassium: 3.6 mmol/L (ref 3.5–5.1)
Sodium: 137 mmol/L (ref 135–145)

## 2022-11-09 LAB — SURGICAL PATHOLOGY

## 2022-11-09 MED ORDER — OXYCODONE HCL 5 MG PO TABS: 5.0000 mg | ORAL_TABLET | ORAL | 0 refills | Status: AC | PRN

## 2022-11-09 MED ORDER — ASPIRIN 325 MG PO TBEC: 325.0000 mg | DELAYED_RELEASE_TABLET | Freq: Every day | ORAL | 0 refills | Status: AC

## 2022-11-09 MED ORDER — ACETAMINOPHEN 500 MG PO TABS: 1000.0000 mg | ORAL_TABLET | Freq: Four times a day (QID) | ORAL | 0 refills | Status: DC

## 2022-11-09 MED ORDER — ONDANSETRON HCL 4 MG PO TABS: 4.0000 mg | ORAL_TABLET | Freq: Four times a day (QID) | ORAL | 0 refills | Status: AC | PRN

## 2022-11-09 NOTE — Progress Notes (Signed)
Occupational Therapy Treatment Patient Details Name: Ryan Benjamin MRN: [id number] DOB: 07-07-1946 Today's Date: 11/09/2022   History of present illness pt is s/p R rTSA 11/08/22; PMH significant for Benign hypertension   Environmental allergies   GERD (gastroesophageal reflux disease)   Hyperlipidemia   Osteoarthritis   Rosacea, L TKA, L rTSA   OT comments  Chart reviewed, pt greeted in room requesting to get dressed. Tx session targeted improving indep in dressing tasks with precautions. Improvements noted throughout with pt reporting he will have assist as needed following discharge. Please see details below. OT will continue to follow acutely.    Recommendations for follow up therapy are one component of a multi-disciplinary discharge planning process, led by the attending physician.  Recommendations may be updated based on patient status, additional functional criteria and insurance authorization.    Follow Up Recommendations  Follow physician's recommendations for discharge plan and follow up therapies     Assistance Recommended at Discharge Intermittent Supervision/Assistance  Patient can return home with the following  A little help with bathing/dressing/bathroom   Equipment Recommendations  None recommended by OT    Recommendations for Other Services      Precautions / Restrictions Precautions Precautions: Fall;Shoulder Shoulder Interventions: Shoulder sling/immobilizer;Shoulder abduction pillow Restrictions Weight Bearing Restrictions: Yes RUE Weight Bearing: Weight bearing as tolerated       Mobility Bed Mobility               General bed mobility comments: NT in recliner pre/post session    Transfers Overall transfer level: Needs assistance Equipment used: None Transfers: Sit to/from Stand Sit to Stand: Supervision                 Balance Overall balance assessment: Needs assistance   Sitting balance-Leahy Scale: Good     Standing balance  support: No upper extremity supported Standing balance-Leahy Scale: Good                             ADL either performed or assessed with clinical judgement   ADL Overall ADL's : Needs assistance/impaired                                       General ADL Comments: MIN A for LB dressing, MOD A for UB dressing (shirt, sling), amb in room with supervision; good recall of education provided in eval    Extremity/Trunk Assessment              Vision       Perception     Praxis      Cognition Arousal/Alertness: Awake/alert Behavior During Therapy: WFL for tasks assessed/performed Overall Cognitive Status: Within Functional Limits for tasks assessed                                          Exercises      Shoulder Instructions       General Comments      Pertinent Vitals/ Pain       Pain Assessment Pain Assessment: No/denies pain  Home Living  Prior Functioning/Environment              Frequency  Min 2X/week        Progress Toward Goals  OT Goals(current goals can now be found in the care plan section)  Progress towards OT goals: Progressing toward goals     Plan Discharge plan remains appropriate    Co-evaluation                 AM-PAC OT "6 Clicks" Daily Activity     Outcome Measure   Help from another person eating meals?: None Help from another person taking care of personal grooming?: None Help from another person toileting, which includes using toliet, bedpan, or urinal?: A Little Help from another person bathing (including washing, rinsing, drying)?: A Little Help from another person to put on and taking off regular upper body clothing?: A Little Help from another person to put on and taking off regular lower body clothing?: A Little 6 Click Score: 20    End of Session    OT Visit Diagnosis: Other abnormalities of gait and  mobility (R26.89)   Activity Tolerance Patient tolerated treatment well   Patient Left in chair;with call bell/phone within reach;with nursing/sitter in room   Nurse Communication Mobility status        Time: [id number] OT Time Calculation (min): 13 min  Charges: OT General Charges $OT Visit: 1 Visit OT Treatments $Self Care/Home Management : 8-22 mins  Shanon Payor, OTD OTR/L  11/09/22, 1:34 PM

## 2022-11-09 NOTE — Discharge Instructions (Signed)
Diet: As you were doing prior to hospitalization  ? ?Shower:  May shower but keep the wounds dry, use an occlusive plastic wrap, NO SOAKING IN TUB.  If the bandage gets wet, change with a clean dry gauze. ? ?Dressing:  You may change your dressing as needed. Change the dressing with sterile gauze dressing.   ? ?Activity:  Increase activity slowly as tolerated, but follow the weight bearing instructions below.  No lifting or driving for 6 weeks. ? ?Weight Bearing:   Non-weightbearing to the right arm. ? ?To prevent constipation: you may use a stool softener such as - ? ?Colace (over the counter) 100 mg by mouth twice a day  ?Drink plenty of fluids (prune juice may be helpful) and high fiber foods ?Miralax (over the counter) for constipation as needed.   ? ?Itching:  If you experience itching with your medications, try taking only a single pain pill, or even half a pain pill at a time.  You may take up to 10 pain pills per day, and you can also use benadryl over the counter for itching or also to help with sleep.  ? ?Precautions:  If you experience chest pain or shortness of breath - call 911 immediately for transfer to the hospital emergency department!! ? ?If you develop a fever greater that 101 F, purulent drainage from wound, increased redness or drainage from wound, or calf pain-Call [facility]                                              ?Follow- Up Appointment:  Please call for an appointment to be seen in 2 weeks at [facility]  ?

## 2022-11-09 NOTE — Progress Notes (Addendum)
Mobility Specialist - Progress Note     11/09/22 1116  Mobility  Activity Ambulated independently in hallway;Stood at bedside  Level of Assistance Independent after set-up  Assistive Device None  Distance Ambulated (ft) 160 ft  RUE Weight Bearing NWB (Non weight bearing SHOULDER.)  Activity Response Tolerated well  Mobility Referral Yes  $Mobility charge 1 Mobility   Pt resting in recliner on RA upon entry. Author temporarily Careers information officer.Pt STS and ambulates indep. Pt returned to recliner, reattached to polarcare and left with needs in reach.

## 2022-11-09 NOTE — Plan of Care (Signed)

## 2022-11-09 NOTE — Discharge Summary (Signed)
Physician Discharge Summary  Patient ID: NASZIER KAYLOR MRN: [id number] DOB/AGE: 06-08-1946 76 y.o.  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  Discharge Diagnoses: Patient Active Problem List   Diagnosis Date Noted   Status post reverse total arthroplasty of right shoulder 11/08/2022   Sepsis secondary to UTI ([place]) 10/15/2019   Hyperlipidemia 10/15/2019   Status post reverse arthroplasty of shoulder, left 06/30/2019   Elevated PSA 04/16/2019   Benign prostatic hyperplasia with lower urinary tract symptoms 04/16/2019   Nephrolithiasis 04/16/2019   Bacteriuria, chronic 04/16/2019    Past Medical History:  Diagnosis Date   Arthritis    BPH with elevated PSA    Dyspnea    Environmental and seasonal allergies    GERD (gastroesophageal reflux disease)    Rosanna Randy syndrome    Hyperlipemia    Kidney stone    Osteoarthritis    Rosacea    Rotator cuff tear, right    Sepsis secondary to UTI ([place])    UTI (urinary tract infection)      Transfusion: None.   Consultants (if any):   Discharged Condition: Improved  Hospital Course: ARZA KOETJE is an 76 y.o. male who was admitted 11/08/2022 with a diagnosis of a massive irreparable rotator cuff tear of the right shoulder and went to the operating room on 11/08/2022 and underwent the above named procedures.    Surgeries: Procedure(s): REVERSE SHOULDER ARTHROPLASTY on 11/08/2022 Patient tolerated the surgery well. Taken to PACU where she was stabilized and then transferred to the orthopedic floor.  Started on Lovenox 40mg  q 24 hrs. Heels elevated on bed with rolled towels. No evidence of DVT. Negative Homan. Physical therapy started on day #1 for gait training and transfer. OT started day #1 for ADL and assisted devices.  Patient's IV was removed on POD1.  Implants: All  press-fit [employer] Comprehensive system with a 14 mm Identity micro-humeral stem, a -6 mm extended neutral identity humeral tray with a +0 mm insert, and a mini-base plate with a 40 mm concentric glenosphere.   He was given perioperative antibiotics:  Anti-infectives (From admission, onward)    Start     Dose/Rate Route Frequency Ordered Stop   11/08/22 1400  ceFAZolin (ANCEF) IVPB 2g/100 mL premix        2 g 200 mL/hr over 30 Minutes Intravenous Every 6 hours 11/08/22 1102 11/09/22 0329   11/08/22 0604  ceFAZolin (ANCEF) 2-4 GM/100ML-% IVPB       Note to Pharmacy: Norton Blizzard A: cabinet override      11/08/22 0604 11/08/22 0803   11/08/22 0600  ceFAZolin (ANCEF) IVPB 2g/100 mL premix        2 g 200 mL/hr over 30 Minutes Intravenous On call to O.R. 11/07/22 2220 11/08/22 0749     .  He was given sequential compression devices, early ambulation, and Lovenox for DVT prophylaxis.  He benefited maximally from the hospital stay and there were no complications.    Recent vital signs:  Vitals:   11/08/22 1932 11/09/22 0025  BP: 103/75 114/71  Pulse: 99 99  Resp: 15 18  Temp: 97.7 F (36.5 C) 97.6 F (36.4 C)  SpO2: 95% 95%    Recent laboratory studies:  Lab Results  Component Value Date   HGB 13.2 11/09/2022   HGB 14.8 11/01/2022   HGB 14.8 10/17/2019   Lab Results  Component Value Date   WBC 14.2 (H) 11/09/2022   PLT 185 11/09/2022   Lab Results  Component Value Date   INR 1.0 10/15/2019   Lab Results  Component Value Date   NA 137 11/09/2022   K 3.6 11/09/2022   CL 106 11/09/2022   CO2 25 11/09/2022   BUN 23 11/09/2022   CREATININE 0.93 11/09/2022   GLUCOSE 112 (H) 11/09/2022    Discharge Medications:   Allergies as of 11/09/2022   No Known Allergies      Medication List     TAKE these medications    acetaminophen 500 MG tablet Commonly known as: TYLENOL Take 2 tablets (1,000 mg total) by mouth every 6 (six) hours.   aspirin EC 325 MG  tablet Take 1 tablet (325 mg total) by mouth daily. What changed:  medication strength how much to take when to take this   atorvastatin 20 MG tablet Commonly known as: LIPITOR Take 20 mg by mouth at bedtime.   levocetirizine 5 MG tablet Commonly known as: XYZAL Take 5 mg by mouth every evening.   LUBRICATING EYE DROPS OP Place 1 drop into both eyes daily as needed (dry eyes).   omeprazole 40 MG capsule Commonly known as: PRILOSEC Take 40 mg by mouth daily.   ondansetron 4 MG tablet Commonly known as: ZOFRAN Take 1 tablet (4 mg total) by mouth every 6 (six) hours as needed for nausea.   oxyCODONE 5 MG immediate release tablet Commonly known as: Oxy IR/ROXICODONE Take 1-2 tablets (5-10 mg total) by mouth every 4 (four) hours as needed for moderate pain (pain score 4-6).   oxymetazoline 0.05 % nasal spray Commonly known as: AFRIN Place 1 spray into both nostrils 2 (two) times daily as needed for congestion.   sildenafil 100 MG tablet Commonly known as: VIAGRA Take 100 mg by mouth daily as needed for erectile dysfunction.   SUPER GREENS PO Take 2 tablets by mouth daily.   tamsulosin 0.4 MG Caps capsule Commonly known as: FLOMAX Take 1 capsule (0.4 mg total) by mouth daily.   TURMERIC CURCUMIN PO Take 1 capsule by mouth 2 (two) times daily.       Diagnostic Studies: DG Shoulder Right Port  Result Date: 11/08/2022 CLINICAL DATA:  Status post reverse total shoulder arthroplasty EXAM: RIGHT SHOULDER - 1 VIEW COMPARISON:  None Available. FINDINGS: Interval postsurgical changes from right reverse total shoulder arthroplasty. Arthroplasty components appear in their expected alignment. No periprosthetic fracture is identified. Expected postoperative changes within the overlying soft tissues. IMPRESSION: Interval postsurgical changes from right reverse total shoulder arthroplasty. Electronically Signed   By: Allegra Lai M.D.   On: 11/08/2022 11:39   [place] OR NERVE  BLOCK-IMAGE ONLY [facility])  Result Date: 11/08/2022 There is no interpretation for this exam.  This order is for images obtained during a surgical procedure.  Please See "Surgeries" Tab for more information regarding the procedure.    Disposition: Plan for discharge home today pending progress during morning session of therapy.     Follow-up Information     Anson [place], PA-C Follow up in 14 day(s).   Specialty: Physician Assistant Why: Mindi Slicker information: [address] [place] [address] [phone]                  Signed: Meriel Pica PA-C 11/09/2022, 8:05 AM

## 2022-11-09 NOTE — Progress Notes (Signed)
  Subjective: 1 Day Post-Op Procedure(s) (LRB): REVERSE SHOULDER ARTHROPLASTY (Right) Patient reports pain as mild.   Patient is well, and has had no acute complaints or problems Plan is to go Home after hospital stay. Negative for chest pain and shortness of breath Fever: no Gastrointestinal:Negative for nausea and vomiting, reports he is passing gas this morning, has not had a BM.  Objective: Vital signs in last 24 hours: Temp:  [97 F (36.1 C)-97.9 F (36.6 C)] 97.6 F (36.4 C) (12/08 0025) Pulse Rate:  [92-104] 99 (12/08 0025) Resp:  [15-19] 18 (12/08 0025) BP: (95-115)/(60-77) 114/71 (12/08 0025) SpO2:  [92 %-96 %] 95 % (12/08 0025)  Intake/Output from previous day:  Intake/Output Summary (Last 24 hours) at 11/09/2022 0757 Last data filed at 11/09/2022 0300 Gross per 24 hour  Intake 1836.25 ml  Output 100 ml  Net 1736.25 ml    Intake/Output this shift: No intake/output data recorded.  Labs: Recent Labs    11/09/22 0617  HGB 13.2   Recent Labs    11/09/22 0617  WBC 14.2*  RBC 4.21*  HCT 38.2*  PLT 185   Recent Labs    11/09/22 0617  NA 137  K 3.6  CL 106  CO2 25  BUN 23  CREATININE 0.93  GLUCOSE 112*  CALCIUM 8.7*   No results for input(s): "LABPT", "INR" in the last 72 hours.  EXAM General - Patient is Alert, Appropriate, and Oriented Extremity - Sling intact to the right arm. Decreased sensation to touch to the right arm from recent nerve block. He is able to flex and extend fingers without pain this morning. Dressing/Incision - clean, dry, no drainage noted to the right shoulder honeycomb dressing. Motor Function - intact, moving foot and toes well on exam.  Abdomen is soft to palpation, intact bowel sounds.  Past Medical History:  Diagnosis Date   Arthritis    BPH with elevated PSA    Dyspnea    Environmental and seasonal allergies    GERD (gastroesophageal reflux disease)    Sullivan Lone syndrome    Hyperlipemia    Kidney stone     Osteoarthritis    Rosacea    Rotator cuff tear, right    Sepsis secondary to UTI ([facility])    UTI (urinary tract infection)     Assessment/Plan: 1 Day Post-Op Procedure(s) (LRB): REVERSE SHOULDER ARTHROPLASTY (Right) Principal Problem:   Status post reverse total arthroplasty of right shoulder  Estimated body mass index is 32.89 kg/m as calculated from the following:   Height as of this encounter: 5\' 7"  (1.702 m).   Weight as of this encounter: 95.3 kg. Advance diet Up with therapy D/C IV fluids when tolerating po intake.  Labs reviewed this AM, WBC 14.2 this morning. Patient is passing gas this morning without pain.   Up with therapy today. Plan for discharge home today following morning session of therapy.  DVT Prophylaxis - Lovenox and TED hose Non-weightbearing to the right arm.  , PA-C [facility] 11/09/2022, 7:57 AM

## 2023-03-12 ENCOUNTER — Encounter: Payer: Self-pay | Admitting: Otolaryngology

## 2023-03-12 ENCOUNTER — Other Ambulatory Visit: Payer: Self-pay | Admitting: Otolaryngology

## 2023-03-12 DIAGNOSIS — J329 Chronic sinusitis, unspecified: Secondary | ICD-10-CM

## 2023-03-27 ENCOUNTER — Ambulatory Visit
Admission: RE | Admit: 2023-03-27 | Discharge: 2023-03-27 | Disposition: A | Discharge status: Home / Self Care | Payer: Medicare PPO | Source: Ambulatory Visit | Attending: Otolaryngology | Admitting: Otolaryngology

## 2023-03-27 DIAGNOSIS — J329 Chronic sinusitis, unspecified: Secondary | ICD-10-CM

## 2023-06-27 ENCOUNTER — Other Ambulatory Visit: Payer: Self-pay | Admitting: *Deleted

## 2023-06-27 DIAGNOSIS — N200 Calculus of kidney: Secondary | ICD-10-CM

## 2023-06-27 DIAGNOSIS — R972 Elevated prostate specific antigen [PSA]: Secondary | ICD-10-CM

## 2023-06-28 ENCOUNTER — Encounter: Payer: Self-pay | Admitting: Urology

## 2023-06-28 ENCOUNTER — Ambulatory Visit
Admission: RE | Admit: 2023-06-28 | Discharge: 2023-06-28 | Disposition: A | Discharge status: Home / Self Care | Payer: Medicare PPO | Attending: Urology | Admitting: Urology

## 2023-06-28 ENCOUNTER — Ambulatory Visit: Payer: Medicare PPO | Admitting: Urology

## 2023-06-28 ENCOUNTER — Ambulatory Visit
Admission: RE | Admit: 2023-06-28 | Discharge: 2023-06-28 | Disposition: A | Discharge status: Home / Self Care | Payer: Medicare PPO | Source: Ambulatory Visit | Attending: Urology | Admitting: Urology

## 2023-06-28 VITALS — BP 114/75 | HR 84 | Ht 67.0 in | Wt 210.0 lb

## 2023-06-28 DIAGNOSIS — R972 Elevated prostate specific antigen [PSA]: Secondary | ICD-10-CM | POA: Diagnosis not present

## 2023-06-28 DIAGNOSIS — N200 Calculus of kidney: Secondary | ICD-10-CM

## 2023-06-28 DIAGNOSIS — N401 Enlarged prostate with lower urinary tract symptoms: Secondary | ICD-10-CM | POA: Diagnosis not present

## 2023-06-28 NOTE — Progress Notes (Signed)
I, Maysun Anabel Bene, acting as a scribe for Riki Altes, MD., have documented all relevant documentation on the behalf of Riki Altes, MD, as directed by Riki Altes, MD while in the presence of Riki Altes, MD.  06/28/2023 11:55 AM   Elliot Gurney 1946-02-25 [id number]  Referring provider: Marguarite Arbour, MD [address] [facility] [place],  [place] [address]  Chief Complaint  Patient presents with   Nephrolithiasis   Urologic history: 1.  Elevated PSA Fluctuating PSA; has elected surveillance Prostate MRI 6/20; 88 cc volume; no indeterminate or high-grade lesions identified PSA at time of MRI 5.8-6.2   2.  Recurrent stone disease Urgent stenting 11/20 for 5 mm distal calculus with sepsis CT with small, bilateral, nonobstructing renal calculi Left ureteroscopic stone removal 12/08/2019 Uretero-pyeloscopy with a 3 mm upper and 2 mm lower calyceal calculi which were removed.   Remaining calculi intraparenchymal Stone analysis 50% calcium oxalate mono/40% calcium oxalate di/10% hydroxyapatite Metabolic evaluation remarkable for low urine volume < 1 L  HPI: VIVIANO FLADGER is a 77 y.o. male presents for annual follow-up.   No urologic problems since last years visit Doing well on tamsulosin, notes occasional urgency Denies flank, abdominal or pelvic pain No dysuria or gross hematuria PSA February 2024 stable at 6.2  PSA trend   Prostate Specific Ag, Serum  Latest Ref Rng 0.0 - 4.0 ng/mL  01/08/2019 6.7 (H)   03/24/2019 5.8 (H)   12/14/2019 7.5 (H)   03/24/2020 4.3 (H)   06/22/2021 4.1 (H)   06/28/2022 4.8 (H)      PMH: Past Medical History:  Diagnosis Date   Arthritis    BPH with elevated PSA    Dyspnea    Environmental and seasonal allergies    GERD (gastroesophageal reflux disease)    Sullivan Lone syndrome    Hyperlipemia    Kidney stone    Osteoarthritis    Rosacea    Rotator cuff tear, right    Sepsis secondary to UTI [facility])    UTI  (urinary tract infection)     Surgical History: Past Surgical History:  Procedure Laterality Date   CYSTOSCOPY WITH STENT PLACEMENT Left 10/15/2019   Procedure: CYSTOSCOPY WITH STENT PLACEMENT;  Surgeon: Sondra Come, MD;  Location: [facility] ORS;  Service: Urology;  Laterality: Left;   CYSTOSCOPY/URETEROSCOPY/HOLMIUM LASER/STENT PLACEMENT Left 12/08/2019   Procedure: CYSTOSCOPY/URETEROSCOPY/HOLMIUM LASER/STENT PLACEMENT;  Surgeon: Riki Altes, MD;  Location: [facility] ORS;  Service: Urology;  Laterality: Left;   EXTRACORPOREAL SHOCK WAVE LITHOTRIPSY     RECONSTRUCTION OF EYELID Right    drooping eyelid   REVERSE SHOULDER ARTHROPLASTY Left 06/30/2019   Procedure: REVERSE SHOULDER ARTHROPLASTY;  Surgeon: Christena Flake, MD;  Location: [facility] ORS;  Service: Orthopedics;  Laterality: Left;   REVERSE SHOULDER ARTHROPLASTY Right 11/08/2022   Procedure: REVERSE SHOULDER ARTHROPLASTY;  Surgeon: Christena Flake, MD;  Location: [facility] ORS;  Service: Orthopedics;  Laterality: Right;    Home Medications:  Allergies as of 06/28/2023   No Known Allergies      Medication List        Accurate as of June 28, 2023 11:55 AM. If you have any questions, ask your nurse or doctor.          STOP taking these medications    acetaminophen 500 MG tablet Commonly known as: TYLENOL Stopped by: Riki Altes   levocetirizine 5 MG tablet Commonly known as: XYZAL Stopped by: Lorin Picket  C Inda Mcglothen       TAKE these medications    aspirin EC 325 MG tablet Take 1 tablet (325 mg total) by mouth daily.   atorvastatin 20 MG tablet Commonly known as: LIPITOR Take 20 mg by mouth at bedtime.   LUBRICATING EYE DROPS OP Place 1 drop into both eyes daily as needed (dry eyes).   omeprazole 40 MG capsule Commonly known as: PRILOSEC Take 40 mg by mouth daily.   ondansetron 4 MG tablet Commonly known as: ZOFRAN Take 1 tablet (4 mg total) by mouth every 6 (six) hours as needed for nausea.   oxyCODONE 5 MG  immediate release tablet Commonly known as: Oxy IR/ROXICODONE Take 1-2 tablets (5-10 mg total) by mouth every 4 (four) hours as needed for moderate pain (pain score 4-6).   oxymetazoline 0.05 % nasal spray Commonly known as: AFRIN Place 1 spray into both nostrils 2 (two) times daily as needed for congestion.   sildenafil 100 MG tablet Commonly known as: VIAGRA Take 100 mg by mouth daily as needed for erectile dysfunction.   SUPER GREENS PO Take 2 tablets by mouth daily.   tamsulosin 0.4 MG Caps capsule Commonly known as: FLOMAX Take 1 capsule (0.4 mg total) by mouth daily.   TURMERIC CURCUMIN PO Take 1 capsule by mouth 2 (two) times daily.        Allergies: No Known Allergies  Family History: Family History  Problem Relation Age of Onset   Prostate cancer Neg Hx    Bladder Cancer Neg Hx    Kidney cancer Neg Hx     Social History:  reports that he has never smoked. He has never used smokeless tobacco. He reports that he does not currently use alcohol. He reports that he does not use drugs.   Physical Exam: BP 114/75   Pulse 84   Ht 5\' 7"  (1.702 m)   Wt 210 lb (95.3 kg)   BMI 32.89 kg/m   Constitutional:  Alert and oriented, No acute distress. HEENT: [place] AT, moist mucus membranes.  Trachea midline, no masses. Cardiovascular: No clubbing, cyanosis, or edema. Respiratory: Normal respiratory effort, no increased work of breathing. GI: Abdomen is soft, nontender, nondistended, no abdominal masses Skin: No rashes, bruises or suspicious lesions. Neurologic: Grossly intact, no focal deficits, moving all 4 extremities. Psychiatric: Normal mood and affect.   Pertinent Imaging: [facility] images reviewed and personally interpreted. Stable bilateral renal calculi  Abdomen 1 view (KUB)  Narrative CLINICAL DATA:  Nephrolithiasis  EXAM: ABDOMEN - 1 VIEW  COMPARISON:  10/15/2019  FINDINGS: Numerous bilateral nonobstructing renal calculi are present measuring up to 3  mm.  Nonobstructive bowel gas pattern.  IMPRESSION: Greater than 10 bilateral renal calculi measuring up to 3 mm.   Electronically Signed By: Acquanetta Belling M.D. On: 06/29/2022 13:38   Assessment & Plan:    1.  BPH with LUTS Stable on tamsulosin. Being refilled by PCP   2.  Nephrolithiasis Stable bilateral nonobstructing renal calculi 1 year follow-up with [facility] We again discussed increasing his water intake to keep urine output between 2-2.5 L/day   3.  Elevated PSA Stable PSA February 2024 at 6.2  [facility] [address] [place], [place] [address] [phone]

## 2023-06-30 ENCOUNTER — Encounter: Payer: Self-pay | Admitting: Urology

## 2024-06-26 ENCOUNTER — Encounter: Payer: Self-pay | Admitting: Urology

## 2024-06-26 ENCOUNTER — Ambulatory Visit
Admission: RE | Admit: 2024-06-26 | Discharge: 2024-06-26 | Disposition: A | Discharge status: Home / Self Care | Source: Ambulatory Visit | Attending: Urology | Admitting: Urology

## 2024-06-26 ENCOUNTER — Ambulatory Visit: Payer: Self-pay | Admitting: Urology

## 2024-06-26 VITALS — BP 105/65 | HR 64 | Ht 67.0 in | Wt 210.0 lb

## 2024-06-26 DIAGNOSIS — R972 Elevated prostate specific antigen [PSA]: Secondary | ICD-10-CM

## 2024-06-26 DIAGNOSIS — N401 Enlarged prostate with lower urinary tract symptoms: Secondary | ICD-10-CM

## 2024-06-26 DIAGNOSIS — N200 Calculus of kidney: Secondary | ICD-10-CM | POA: Diagnosis present

## 2024-06-26 NOTE — Progress Notes (Signed)
 06/26/2024 10:49 AM   Ryan Benjamin 26-Oct-1946 [id number]  Referring provider: Auston Reyes BIRCH, MD [address] [facility] [place],  [place] [address]  Chief Complaint  Patient presents with   Follow-up   Urologic history: 1.  Elevated PSA Fluctuating PSA; has elected surveillance Prostate MRI 6/20; 88 cc volume; no indeterminate or high-grade lesions identified PSA at time of MRI 5.8-6.2   2.  Recurrent stone disease Urgent stenting 11/20 for 5 mm distal calculus with sepsis CT with small, bilateral, nonobstructing renal calculi Left ureteroscopic stone removal 12/08/2019 Uretero-pyeloscopy with a 3 mm upper and 2 mm lower calyceal calculi which were removed.   Remaining calculi intraparenchymal Stone analysis 50% calcium  oxalate mono/40% calcium  oxalate di/10% hydroxyapatite Metabolic evaluation remarkable for low urine volume < 1 L  HPI: Ryan Benjamin is a 78 y.o. male presents for annual follow-up.   No urologic problems since last years visit Doing well on tamsulosin , notes occasional urgency Denies flank, abdominal or pelvic pain No dysuria or gross hematuria PSA checked by Dr. Auston and has been stable  PSA trend   Prostate Specific Ag, Serum  Latest Ref Rng 0.0 - 4.0 ng/mL  01/08/2019 6.7   03/24/2019 5.8   12/14/2019 7.5   03/24/2020 4.3   06/22/2021 4.1   06/28/2022 4.8   01/14/2023 6.2  01/16/2024 5.75     PMH: Past Medical History:  Diagnosis Date   Arthritis    BPH with elevated PSA    Dyspnea    Environmental and seasonal allergies    GERD (gastroesophageal reflux disease)    Bertrum syndrome    Hyperlipemia    Kidney stone    Osteoarthritis    Rosacea    Rotator cuff tear, right    Sepsis secondary to UTI [facility])    UTI (urinary tract infection)     Surgical History: Past Surgical History:  Procedure Laterality Date   CYSTOSCOPY WITH STENT PLACEMENT Left 10/15/2019   Procedure: CYSTOSCOPY WITH STENT PLACEMENT;  Surgeon:  Francisca Redell BROCKS, MD;  Location: [facility] ORS;  Service: Urology;  Laterality: Left;   CYSTOSCOPY/URETEROSCOPY/HOLMIUM LASER/STENT PLACEMENT Left 12/08/2019   Procedure: CYSTOSCOPY/URETEROSCOPY/HOLMIUM LASER/STENT PLACEMENT;  Surgeon: Twylla Glendia BROCKS, MD;  Location: [facility];  Service: Urology;  Laterality: Left;   EXTRACORPOREAL SHOCK WAVE LITHOTRIPSY     RECONSTRUCTION OF EYELID Right    drooping eyelid   REVERSE SHOULDER ARTHROPLASTY Left 06/30/2019   Procedure: REVERSE SHOULDER ARTHROPLASTY;  Surgeon: Edie Norleen PARAS, MD;  Location: [facility];  Service: Orthopedics;  Laterality: Left;   REVERSE SHOULDER ARTHROPLASTY Right 11/08/2022   Procedure: REVERSE SHOULDER ARTHROPLASTY;  Surgeon: Edie Norleen PARAS, MD;  Location: [facility];  Service: Orthopedics;  Laterality: Right;    Home Medications:  Allergies as of 06/26/2024   No Known Allergies      Medication List        Accurate as of June 26, 2024 10:49 AM. If you have any questions, ask your nurse or doctor.          aspirin  EC 325 MG tablet Take 1 tablet (325 mg total) by mouth daily.   atorvastatin  20 MG tablet Commonly known as: LIPITOR Take 20 mg by mouth at bedtime.   LUBRICATING EYE DROPS OP Place 1 drop into both eyes daily as needed (dry eyes).   omeprazole 40 MG capsule Commonly known as: PRILOSEC Take 40 mg by mouth daily.   ondansetron  4 MG tablet Commonly known as:  ZOFRAN  Take 1 tablet (4 mg total) by mouth every 6 (six) hours as needed for nausea.   oxyCODONE  5 MG immediate release tablet Commonly known as: Oxy IR/ROXICODONE  Take 1-2 tablets (5-10 mg total) by mouth every 4 (four) hours as needed for moderate pain (pain score 4-6).   oxymetazoline  0.05 % nasal spray Commonly known as: AFRIN Place 1 spray into both nostrils 2 (two) times daily as needed for congestion.   sildenafil 100 MG tablet Commonly known as: VIAGRA Take 100 mg by mouth daily as needed for erectile dysfunction.   SUPER GREENS PO Take  2 tablets by mouth daily.   tamsulosin  0.4 MG Caps capsule Commonly known as: FLOMAX  Take 1 capsule (0.4 mg total) by mouth daily.   TURMERIC CURCUMIN PO Take 1 capsule by mouth 2 (two) times daily.        Allergies: No Known Allergies  Family History: Family History  Problem Relation Age of Onset   Prostate cancer Neg Hx    Bladder Cancer Neg Hx    Kidney cancer Neg Hx     Social History:  reports that he has never smoked. He has never used smokeless tobacco. He reports that he does not currently use alcohol . He reports that he does not use drugs.   Physical Exam: BP 105/65   Pulse 64   Ht 5' 7 (1.702 m)   Wt 210 lb (95.3 kg)   BMI 32.89 kg/m   Constitutional:  Alert and oriented, No acute distress. HEENT: [place] AT Respiratory: Normal respiratory effort, no increased work of breathing. Psychiatric: Normal mood and affect.   Pertinent Imaging: [facility] images performed earlier today were reviewed and personally interpreted. Stable bilateral renal calculi.  Not yet read by radiology    Assessment & Plan:    1.  BPH with LUTS Stable on tamsulosin .   2.  Nephrolithiasis Stable bilateral nonobstructing renal calculi 1 year follow-up with [facility]    3.  Elevated PSA Stable     Glendia JAYSON Barba, MD  [facility] [address] [place], [place] [address] [phone]

## 2025-06-29 ENCOUNTER — Ambulatory Visit: Admitting: Urology
